# Patient Record
Sex: Female | Born: 1965 | Race: White | Hispanic: No | Marital: Married | Smoking: Current every day smoker
Health system: Southern US, Community
[De-identification: ages and names within clinical notes are randomized; demographics above are authoritative.]

## PROBLEM LIST (undated history)

## (undated) DIAGNOSIS — Z789 Other specified health status: Secondary | ICD-10-CM

## (undated) DIAGNOSIS — R112 Nausea with vomiting, unspecified: Secondary | ICD-10-CM

## (undated) DIAGNOSIS — T8859XA Other complications of anesthesia, initial encounter: Secondary | ICD-10-CM

## (undated) DIAGNOSIS — Z9889 Other specified postprocedural states: Secondary | ICD-10-CM

## (undated) DIAGNOSIS — T4145XA Adverse effect of unspecified anesthetic, initial encounter: Secondary | ICD-10-CM

## (undated) DIAGNOSIS — K219 Gastro-esophageal reflux disease without esophagitis: Secondary | ICD-10-CM

## (undated) DIAGNOSIS — K449 Diaphragmatic hernia without obstruction or gangrene: Secondary | ICD-10-CM

## (undated) DIAGNOSIS — K2980 Duodenitis without bleeding: Secondary | ICD-10-CM

## (undated) HISTORY — PX: HIP SURGERY: SHX245

## (undated) HISTORY — PX: DIAGNOSTIC LAPAROSCOPY: SUR761

## (undated) HISTORY — DX: Gastro-esophageal reflux disease without esophagitis: K21.9

---

## 1998-04-15 ENCOUNTER — Other Ambulatory Visit: Admission: RE | Admit: 1998-04-15 | Discharge: 1998-04-15 | Payer: Self-pay | Admitting: Obstetrics and Gynecology

## 1999-05-10 ENCOUNTER — Other Ambulatory Visit: Admission: RE | Admit: 1999-05-10 | Discharge: 1999-05-10 | Payer: Self-pay | Admitting: Obstetrics and Gynecology

## 2000-06-29 ENCOUNTER — Other Ambulatory Visit: Admission: RE | Admit: 2000-06-29 | Discharge: 2000-06-29 | Payer: Self-pay | Admitting: Obstetrics and Gynecology

## 2001-12-24 ENCOUNTER — Other Ambulatory Visit: Admission: RE | Admit: 2001-12-24 | Discharge: 2001-12-24 | Payer: Self-pay | Admitting: Obstetrics and Gynecology

## 2002-12-12 ENCOUNTER — Emergency Department (HOSPITAL_COMMUNITY): Admission: EM | Admit: 2002-12-12 | Discharge: 2002-12-12 | Payer: Self-pay | Admitting: Emergency Medicine

## 2003-01-06 ENCOUNTER — Other Ambulatory Visit: Admission: RE | Admit: 2003-01-06 | Discharge: 2003-01-06 | Payer: Self-pay | Admitting: Obstetrics and Gynecology

## 2004-03-15 ENCOUNTER — Other Ambulatory Visit: Admission: RE | Admit: 2004-03-15 | Discharge: 2004-03-15 | Payer: Self-pay | Admitting: Obstetrics and Gynecology

## 2004-11-22 ENCOUNTER — Ambulatory Visit: Payer: Self-pay | Admitting: Internal Medicine

## 2004-12-07 ENCOUNTER — Ambulatory Visit (HOSPITAL_COMMUNITY): Admission: RE | Admit: 2004-12-07 | Discharge: 2004-12-07 | Payer: Self-pay | Admitting: Internal Medicine

## 2004-12-07 ENCOUNTER — Ambulatory Visit: Payer: Self-pay | Admitting: Internal Medicine

## 2005-04-06 ENCOUNTER — Other Ambulatory Visit: Admission: RE | Admit: 2005-04-06 | Discharge: 2005-04-06 | Payer: Self-pay | Admitting: Obstetrics and Gynecology

## 2006-07-24 ENCOUNTER — Ambulatory Visit: Payer: Self-pay | Admitting: Orthopedic Surgery

## 2006-07-31 ENCOUNTER — Encounter (HOSPITAL_COMMUNITY): Admission: RE | Admit: 2006-07-31 | Discharge: 2006-08-14 | Payer: Self-pay | Admitting: Orthopedic Surgery

## 2006-08-17 ENCOUNTER — Encounter (HOSPITAL_COMMUNITY): Admission: RE | Admit: 2006-08-17 | Discharge: 2006-09-16 | Payer: Self-pay | Admitting: Orthopedic Surgery

## 2007-02-26 ENCOUNTER — Ambulatory Visit (HOSPITAL_COMMUNITY): Admission: RE | Admit: 2007-02-26 | Discharge: 2007-02-26 | Payer: Self-pay | Admitting: Internal Medicine

## 2010-09-27 ENCOUNTER — Other Ambulatory Visit: Payer: Self-pay | Admitting: Orthopedic Surgery

## 2010-09-27 ENCOUNTER — Encounter (HOSPITAL_COMMUNITY): Payer: BC Managed Care – PPO

## 2010-09-27 DIAGNOSIS — Z01810 Encounter for preprocedural cardiovascular examination: Secondary | ICD-10-CM | POA: Insufficient documentation

## 2010-09-27 DIAGNOSIS — Z01812 Encounter for preprocedural laboratory examination: Secondary | ICD-10-CM | POA: Insufficient documentation

## 2010-09-27 DIAGNOSIS — Z01811 Encounter for preprocedural respiratory examination: Secondary | ICD-10-CM | POA: Insufficient documentation

## 2010-09-27 LAB — URINALYSIS, ROUTINE W REFLEX MICROSCOPIC
Ketones, ur: NEGATIVE mg/dL
Nitrite: NEGATIVE
Protein, ur: NEGATIVE mg/dL
Specific Gravity, Urine: 1.019 (ref 1.005–1.030)
Urine Glucose, Fasting: NEGATIVE mg/dL
pH: 6.5 (ref 5.0–8.0)

## 2010-09-27 LAB — COMPREHENSIVE METABOLIC PANEL
Albumin: 4.4 g/dL (ref 3.5–5.2)
BUN: 16 mg/dL (ref 6–23)
Calcium: 9.5 mg/dL (ref 8.4–10.5)
Glucose, Bld: 96 mg/dL (ref 70–99)
Sodium: 139 mEq/L (ref 135–145)
Total Protein: 7.3 g/dL (ref 6.0–8.3)

## 2010-09-27 LAB — PROTIME-INR
INR: 1.02 (ref 0.00–1.49)
Prothrombin Time: 13.6 seconds (ref 11.6–15.2)

## 2010-09-27 LAB — CBC
MCHC: 35 g/dL (ref 30.0–36.0)
RDW: 12.7 % (ref 11.5–15.5)

## 2010-09-27 LAB — HCG, SERUM, QUALITATIVE: Preg, Serum: NEGATIVE

## 2010-09-27 LAB — SURGICAL PCR SCREEN
MRSA, PCR: INVALID — AB
Staphylococcus aureus: INVALID — AB

## 2010-09-30 LAB — MRSA CULTURE

## 2010-10-01 ENCOUNTER — Ambulatory Visit (HOSPITAL_COMMUNITY)
Admission: RE | Admit: 2010-10-01 | Discharge: 2010-10-01 | Disposition: A | Discharge status: Home / Self Care | Payer: BC Managed Care – PPO | Source: Ambulatory Visit | Attending: Orthopedic Surgery | Admitting: Orthopedic Surgery

## 2010-10-01 ENCOUNTER — Ambulatory Visit (HOSPITAL_COMMUNITY): Payer: BC Managed Care – PPO

## 2010-10-01 DIAGNOSIS — F17200 Nicotine dependence, unspecified, uncomplicated: Secondary | ICD-10-CM | POA: Insufficient documentation

## 2010-10-01 DIAGNOSIS — K219 Gastro-esophageal reflux disease without esophagitis: Secondary | ICD-10-CM | POA: Insufficient documentation

## 2010-10-01 DIAGNOSIS — M24159 Other articular cartilage disorders, unspecified hip: Secondary | ICD-10-CM | POA: Insufficient documentation

## 2010-10-06 NOTE — Op Note (Signed)
NAMECLARISA, Meyers            ACCOUNT NO.:  [id number]  MEDICAL RECORD NO.:  [id number]           PATIENT TYPE:  O  LOCATION:  DAYL                         FACILITY:  [facility]  PHYSICIAN:  Ollen Gross, M.D.    DATE OF BIRTH:  12-06-1965  DATE OF PROCEDURE:  10/01/2010 DATE OF DISCHARGE:                              OPERATIVE REPORT   PREOPERATIVE DIAGNOSIS:  Left hip labral tear.  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  PROCEDURE:  Left hip arthroscopy with labral debridement and chondroplasty.  SURGEON:  Ollen Gross, MD  ASSISTANT:  None.  ANESTHESIA:  General.  ESTIMATED BLOOD LOSS:  Minimal.  DRAIN:  None.  COMPLICATIONS:  None.  CONDITION:  Stable to Recovery.  BRIEF CLINICAL NOTE:  Kimberly Meyers is a 45 year old female with long history of significant left hip pain, popping, and progressive dysfunction.  She had an exam and history suggestive of a labral tear, confirmed by MRI.  She presents now for arthroscopy and debridement.  PROCEDURE IN DETAIL:  After successful administration of general anesthetic, the patient was placed in the right lateral decubitus position with the left side up and held on the fracture table.  Left lower extremity was draped over the well-padded perineal post and foot placed into a well-padded foot holder.  Under fluoroscopic guidance, traction was applied across the hip until it was adequately distracted. Once adequately distracted, it was locked in that position.  The thigh was then prepped and draped in the usual sterile fashion.  Spinal needles passed through the sites marked in the anterior and posterior peritrochanteric portals. Small incision was made, an inflow cannula passed over a cannulated system to enter the joint.  Once confirmed to be intra-articular, then inflow was initiated.  Femoral head looked normal throughout its extent.  Posterior half of the acetabulum was normal.  There was a  tear at the anterior inferior acetabulum at about the 7 o'clock position and also a larger tear in the labrum and that the chondral delamination of the acetabulum at about the 9 to 11 o'clock position.  We created the anterior portal over a nitinol wire.  The anterior inferior labrum showed no detachment and the freed area was debrided back to stable base with the shaver.  The chondral defects about 1 x 1 or 1 x 2 cm in the anterior acetabulum and that was debrided back to stable bony base with stable cartilaginous edges.  The labrum was debrided back to a stable base with the shaver.  We then used the ArthroCare to seal the labrum off.  The labrum was found to be stable. The rest of the joints again inspected.  No other tears, defects, or loose bodies noted.  The arthroscopic equipments were then removed from the anterior portal and then 20 cc of 0.25% Marcaine with epinephrine injected through the inflow cannula, then that portal was removed and the traction released off the joint and the hip was reduced.  The incisions were then closed with interrupted 4-0 nylon.  Then, I cleaned and dried and a bulky sterile dressing was applied.  Hip was placed through range  of motion which was a full range of motion without any popping or catching.  She was subsequently awakened and transported to Recovery in stable condition.     Ollen Gross, M.D.     FA/MEDQ  D:  10/01/2010  T:  10/01/2010  Job:  [id number]  Electronically Signed by Ollen Gross M.D. on 10/06/2010 04:47:42 PM

## 2010-12-31 NOTE — Op Note (Signed)
NAMEALICESON, Kimberly Meyers            ACCOUNT NO.:  [id number]   MEDICAL RECORD NO.:  [id number]          PATIENT TYPE:  AMB   LOCATION:  DAY                           FACILITY:  [facility]   PHYSICIAN:  Lionel December, M.D.    DATE OF BIRTH:  [birth year]   DATE OF PROCEDURE:  12/07/2004  DATE OF DISCHARGE:                                 OPERATIVE REPORT   PROCEDURE:  Esophagogastroduodenoscopy followed by total colonoscopy.   INDICATIONS:  The patient is 45 year old Caucasian female with over a year  history of symptoms of GERD poorly controlled with OTC meds. She also has  over 6-week history of diarrhea with negative stool studies and she was  noted to have heme-positive stool. She is undergoing both diagnostic EGD and  colonoscopy. Procedure risks were reviewed with the patient, informed  consent was obtained.   PREMEDICATION:  Cetacaine spray for pharyngeal topical anesthesia, Demerol  50 milligrams IV, Versed 10 milligrams IV in divided dose.   FINDINGS:  Procedure performed in endoscopy suite. The patient's vital signs  and O2 sat were monitored during the procedure and remained stable.   PROCEDURE:  1.  Esophagogastroduodenoscopy. The patient was placed left lateral      position. Olympus videoscope was passed by oropharynx without any      difficulty into esophagus.   Esophagus. Mucosa of the esophagus was normal throughout. Squamocolumnar  junction was at 38 cm. No hernia was noted.   Stomach was empty and distended very well with insufflation. Folds proximal  stomach were normal. Examination of mucosa at body, antrum, pyloric channel  as well as angularis, fundus and cardia was normal.   Duodenum bulbar mucosa was normal. Scope was placed in the second part of  the duodenum. The mucosa and folds were normal. Endoscope was withdrawn. The  patient prepared for procedure #2.   Colonoscopy. Rectal examination performed. No abnormality noted on external  or digital exam.  Olympus videoscope was placed rectum and advanced under  vision into sigmoid colon and beyond. Preparation was satisfactory. Scope  was passed to cecum which was identified by appendiceal orifice and  ileocecal valve. T I was also examined for 15-20 cm and reveals normal  mucosal pattern. Pictures taken for the record. As the scope was withdrawn  colonic mucosa was examined for the second time and no other mucosal  abnormalities were noted. Rectal mucosa similarly was normal. Scope was  retroflexed to examine anorectal junction which was unremarkable. Endoscope  was straightened and withdrawn. The patient tolerated the procedure well.   FINAL DIAGNOSIS:  Normal esophagogastroduodenoscopy.   Normal colonoscopy and terminal ileoscopy.   She has chronic uncomplicated GERD and I suspect she has IBS although it  would not explain her heme-positive stools.   RECOMMENDATIONS:  She will continue antireflux measures and Protonix 40  milligrams p.o. q.a.m.  Levbid half to 1 tablet p.o. q.a.m.. Will hold off  adding fiber since she had more cramping and diarrhea with fiber in the  past.   Prescription given for both medicines for a month with five refills. She  will return for OV in  eight to 10 weeks. If she remains with diarrhea and  cramps, will consider small-bowel follow-through.      NR/MEDQ  D:  12/07/2004  T:  12/07/2004  Job:  [id number]   cc:   Barbaraann Barthel, M.D.  [address]  [place]  [place] [address]  Fax: [phone]

## 2013-02-19 ENCOUNTER — Other Ambulatory Visit: Payer: Self-pay

## 2013-02-19 DIAGNOSIS — Z1231 Encounter for screening mammogram for malignant neoplasm of breast: Secondary | ICD-10-CM

## 2013-03-11 ENCOUNTER — Ambulatory Visit
Admission: RE | Admit: 2013-03-11 | Discharge: 2013-03-11 | Disposition: A | Discharge status: Home / Self Care | Payer: BC Managed Care – PPO | Source: Ambulatory Visit

## 2013-03-11 DIAGNOSIS — Z1231 Encounter for screening mammogram for malignant neoplasm of breast: Secondary | ICD-10-CM

## 2013-12-09 ENCOUNTER — Other Ambulatory Visit (HOSPITAL_COMMUNITY): Payer: Self-pay | Admitting: Internal Medicine

## 2013-12-09 DIAGNOSIS — R1010 Upper abdominal pain, unspecified: Secondary | ICD-10-CM

## 2013-12-11 ENCOUNTER — Ambulatory Visit (HOSPITAL_COMMUNITY)
Admission: RE | Admit: 2013-12-11 | Discharge: 2013-12-11 | Disposition: A | Discharge status: Home / Self Care | Payer: BC Managed Care – PPO | Source: Ambulatory Visit | Attending: Internal Medicine | Admitting: Internal Medicine

## 2013-12-11 DIAGNOSIS — R109 Unspecified abdominal pain: Secondary | ICD-10-CM | POA: Insufficient documentation

## 2013-12-11 DIAGNOSIS — R1010 Upper abdominal pain, unspecified: Secondary | ICD-10-CM

## 2014-02-03 ENCOUNTER — Other Ambulatory Visit: Payer: Self-pay

## 2014-02-03 DIAGNOSIS — Z1231 Encounter for screening mammogram for malignant neoplasm of breast: Secondary | ICD-10-CM

## 2014-03-17 ENCOUNTER — Encounter (INDEPENDENT_AMBULATORY_CARE_PROVIDER_SITE_OTHER): Payer: Self-pay

## 2014-03-17 ENCOUNTER — Ambulatory Visit
Admission: RE | Admit: 2014-03-17 | Discharge: 2014-03-17 | Disposition: A | Discharge status: Home / Self Care | Payer: BC Managed Care – PPO | Source: Ambulatory Visit

## 2014-03-17 DIAGNOSIS — Z1231 Encounter for screening mammogram for malignant neoplasm of breast: Secondary | ICD-10-CM

## 2014-04-07 ENCOUNTER — Ambulatory Visit (HOSPITAL_COMMUNITY)
Admission: RE | Admit: 2014-04-07 | Discharge: 2014-04-07 | Disposition: A | Discharge status: Home / Self Care | Payer: BC Managed Care – PPO | Source: Ambulatory Visit | Attending: Internal Medicine | Admitting: Internal Medicine

## 2014-04-07 ENCOUNTER — Other Ambulatory Visit (HOSPITAL_COMMUNITY): Payer: Self-pay | Admitting: Internal Medicine

## 2014-04-07 DIAGNOSIS — R079 Chest pain, unspecified: Secondary | ICD-10-CM | POA: Diagnosis not present

## 2014-05-20 ENCOUNTER — Encounter (INDEPENDENT_AMBULATORY_CARE_PROVIDER_SITE_OTHER): Payer: Self-pay | Admitting: *Deleted

## 2014-06-16 ENCOUNTER — Telehealth (INDEPENDENT_AMBULATORY_CARE_PROVIDER_SITE_OTHER): Payer: Self-pay | Admitting: *Deleted

## 2014-06-16 ENCOUNTER — Encounter (INDEPENDENT_AMBULATORY_CARE_PROVIDER_SITE_OTHER): Payer: Self-pay | Admitting: Internal Medicine

## 2014-06-16 ENCOUNTER — Other Ambulatory Visit (INDEPENDENT_AMBULATORY_CARE_PROVIDER_SITE_OTHER): Payer: Self-pay | Admitting: *Deleted

## 2014-06-16 ENCOUNTER — Ambulatory Visit (INDEPENDENT_AMBULATORY_CARE_PROVIDER_SITE_OTHER): Payer: BC Managed Care – PPO | Admitting: Internal Medicine

## 2014-06-16 VITALS — BP 134/62 | HR 80 | Temp 98.1°F | Ht 67.0 in | Wt 134.2 lb

## 2014-06-16 DIAGNOSIS — R195 Other fecal abnormalities: Secondary | ICD-10-CM

## 2014-06-16 DIAGNOSIS — Z1211 Encounter for screening for malignant neoplasm of colon: Secondary | ICD-10-CM

## 2014-06-16 NOTE — Telephone Encounter (Signed)
Patient needs movi prep 

## 2014-06-16 NOTE — Patient Instructions (Signed)
Colonoscopy.  The risks and benefits such as perforation, bleeding, and infection were reviewed with the patient and is agreeable. 

## 2014-06-16 NOTE — Progress Notes (Addendum)
   Subjective:    Patient ID: Kimberly Meyers, female    DOB: 04/04/1966, 48 y.o.   MRN: [id number]  HPI Referred to our office by Dr. Rana SnareLowe ([place] in [place]) for irregular bowel movements. She will sometimes have one stool a day and then she may have 6 stools a day. She sometimes has diarrhea. She has not tried anything for th is. She says most of her stools are formed. She says she will have diarrhea usually after drinking eight beers. Symptoms for several months. Before this, she was having at least one stool a day.  Stools are small now. She describes as snakey. Her stool at Dr. Vance GatherLowe's office was positive. She denies any reflux with the Protonix. Appetite is good. No unintentional weight loss.   No melena or BRRB. No family hx of colon cancer. Family hx of stomach,lung and breast cancer. Colonoscopy years ago by Dr Karilyn Cotaehman and she thinks is was normal  No NSAIDs.   12/11/2013 US abdomen:  IMPRESSION: Unremarkable abdominal ultrasound.      Review of Systems      Past Medical History  Diagnosis Date  . GERD (gastroesophageal reflux disease)     Past Surgical History  Procedure Laterality Date  . Hip surgery      torn cartilidge repair.  (left)    Allergies  Allergen Reactions  . Penicillins     Hives  . Sulfa Antibiotics     Hives, itching   Current outpatient prescriptions: clonazePAM (KLONOPIN) 0.5 MG tablet, Take 0.5 mg by mouth 2 (two) times daily as needed for anxiety., Disp: , Rfl: ;  pantoprazole (PROTONIX) 40 MG tablet, Take 40 mg by mouth daily., Disp: , Rfl:   No current outpatient prescriptions on file prior to visit.   No current facility-administered medications on file prior to visit.     No current outpatient prescriptions on file prior to visit.   No current facility-administered medications on file prior to visit.   No current outpatient prescriptions on file prior to visit.   No current facility-administered medications on file  prior to visit.         Objective:   Physical Exam  Filed Vitals:   06/16/14 1557  Height: 5\' 7"  (1.702 m)  Weight: 134 lb 3.2 oz (60.873 kg)   Alert and oriented. Skin warm and dry. Oral mucosa is moist.   . Sclera anicteric, conjunctivae is pink. Thyroid not enlarged. No cervical lymphadenopathy. Lungs clear. Heart regular rate and rhythm.  Abdomen is soft. Bowel sounds are positive. No hepatomegaly. No abdominal masses felt. No tenderness.  No edema to lower extremities. Stools brown and guaiac negative.        Assessment & Plan:  Rectal bleeding. Colonic neoplasm needs to be rule out.  Hemorroid, polyp, AVM in the differential. Colonoscopy

## 2014-06-17 MED ORDER — PEG-KCL-NACL-NASULF-NA ASC-C 100 G PO SOLR: 1.0000 | Freq: Once | ORAL | Status: DC

## 2014-06-26 DIAGNOSIS — K219 Gastro-esophageal reflux disease without esophagitis: Secondary | ICD-10-CM | POA: Diagnosis not present

## 2014-06-26 DIAGNOSIS — K921 Melena: Secondary | ICD-10-CM | POA: Diagnosis not present

## 2014-06-26 DIAGNOSIS — K644 Residual hemorrhoidal skin tags: Secondary | ICD-10-CM | POA: Diagnosis not present

## 2014-06-26 DIAGNOSIS — Z79899 Other long term (current) drug therapy: Secondary | ICD-10-CM | POA: Diagnosis not present

## 2014-06-27 ENCOUNTER — Encounter (HOSPITAL_COMMUNITY)
Admission: RE | Disposition: A | Discharge status: Home / Self Care | Payer: Self-pay | Source: Ambulatory Visit | Attending: Internal Medicine

## 2014-06-27 ENCOUNTER — Ambulatory Visit (HOSPITAL_COMMUNITY)
Admission: RE | Admit: 2014-06-27 | Discharge: 2014-06-27 | Disposition: A | Discharge status: Home / Self Care | Payer: BC Managed Care – PPO | Source: Ambulatory Visit | Attending: Internal Medicine | Admitting: Internal Medicine

## 2014-06-27 ENCOUNTER — Encounter (HOSPITAL_COMMUNITY): Payer: Self-pay

## 2014-06-27 DIAGNOSIS — Z79899 Other long term (current) drug therapy: Secondary | ICD-10-CM | POA: Insufficient documentation

## 2014-06-27 DIAGNOSIS — R194 Change in bowel habit: Secondary | ICD-10-CM

## 2014-06-27 DIAGNOSIS — K219 Gastro-esophageal reflux disease without esophagitis: Secondary | ICD-10-CM | POA: Insufficient documentation

## 2014-06-27 DIAGNOSIS — K921 Melena: Secondary | ICD-10-CM | POA: Diagnosis not present

## 2014-06-27 DIAGNOSIS — K644 Residual hemorrhoidal skin tags: Secondary | ICD-10-CM | POA: Insufficient documentation

## 2014-06-27 DIAGNOSIS — R195 Other fecal abnormalities: Secondary | ICD-10-CM

## 2014-06-27 DIAGNOSIS — K649 Unspecified hemorrhoids: Secondary | ICD-10-CM

## 2014-06-27 HISTORY — DX: Other complications of anesthesia, initial encounter: T88.59XA

## 2014-06-27 HISTORY — DX: Adverse effect of unspecified anesthetic, initial encounter: T41.45XA

## 2014-06-27 HISTORY — DX: Other specified health status: Z78.9

## 2014-06-27 HISTORY — DX: Other specified postprocedural states: R11.2

## 2014-06-27 HISTORY — DX: Other specified postprocedural states: Z98.890

## 2014-06-27 HISTORY — PX: COLONOSCOPY: SHX5424

## 2014-06-27 SURGERY — COLONOSCOPY
Anesthesia: Moderate Sedation

## 2014-06-27 MED ORDER — MEPERIDINE HCL 50 MG/ML IJ SOLN
INTRAMUSCULAR | Status: DC | PRN
  Administered 2014-06-27 (×2): 25 mg via INTRAVENOUS

## 2014-06-27 MED ORDER — STERILE WATER FOR IRRIGATION IR SOLN
Status: DC | PRN
  Administered 2014-06-27: 10:00:00

## 2014-06-27 MED ORDER — MEPERIDINE HCL 50 MG/ML IJ SOLN
INTRAMUSCULAR | Status: AC
  Filled 2014-06-27: qty 1

## 2014-06-27 MED ORDER — BENEFIBER DRINK MIX PO PACK: 4.0000 g | PACK | Freq: Every day | ORAL | Status: AC

## 2014-06-27 MED ORDER — MIDAZOLAM HCL 5 MG/5ML IJ SOLN
INTRAMUSCULAR | Status: AC
  Filled 2014-06-27: qty 10

## 2014-06-27 MED ORDER — SODIUM CHLORIDE 0.9 % IV SOLN
INTRAVENOUS | Status: DC
  Administered 2014-06-27: 08:00:00 via INTRAVENOUS

## 2014-06-27 MED ORDER — MIDAZOLAM HCL 5 MG/5ML IJ SOLN
INTRAMUSCULAR | Status: DC | PRN
  Administered 2014-06-27 (×3): 2 mg via INTRAVENOUS

## 2014-06-27 NOTE — H&P (Signed)
Kimberly Meyers is an 48 y.o. female.   Chief Complaint: Patient is here for colonoscopy. HPI: Patient is 48 year old Caucasian female who has irregular bowel movements. She was recently noted to have heme-positive stools by her gynecologist Dr. Corinna Meyers.she has noted change in her bowel habits over the last few months. She denies rectal bleeding abdominal pain or weight loss.patient states she had colonoscopy several years ago and was normal. Family history is negative for CRC but her mother has had colonic polyps.  Past Medical History  Diagnosis Date  . GERD (gastroesophageal reflux disease)   . Complication of anesthesia   . PONV (postoperative nausea and vomiting)   . Medical history non-contributory        History of endometriosis. Had laparoscopy years ago.  Past Surgical History  Procedure Laterality Date  . Hip surgery      torn cartilidge repair.  (left)    History reviewed. No pertinent family history. Social History:  reports that she has been smoking.  She does not have any smokeless tobacco history on file. She reports that she drinks alcohol. She reports that she does not use illicit drugs.  Allergies:  Allergies  Allergen Reactions  . Penicillins     Hives  . Sulfa Antibiotics     Hives, itching    Medications Prior to Admission  Medication Sig Dispense Refill  . clonazePAM (KLONOPIN) 0.5 MG tablet Take 0.5 mg by mouth 2 (two) times daily as needed for anxiety.    . pantoprazole (PROTONIX) 40 MG tablet Take 40 mg by mouth daily.    . peg 3350 powder (MOVIPREP) 100 G SOLR Take 1 kit (200 g total) by mouth once. 1 kit 0    No results found for this or any previous visit (from the past 48 hour(s)). No results found.  ROS  Blood pressure 124/76, pulse 78, temperature 97.8 F (36.6 C), temperature source Oral, resp. rate 14, SpO2 97 %. Physical Exam  Constitutional: She appears well-developed and well-nourished.  HENT:  Mouth/Throat: Oropharynx is clear and  moist.  Eyes: Conjunctivae are normal. No scleral icterus.  Neck: No thyromegaly present.  Cardiovascular: Normal rate, regular rhythm and normal heart sounds.   No murmur heard. Respiratory: Effort normal and breath sounds normal.  GI: Soft. She exhibits no mass. There is tenderness (mild midepigastric tenderness).  Musculoskeletal: She exhibits no edema.  Lymphadenopathy:    She has no cervical adenopathy.  Neurological: She is alert.  Skin: Skin is warm and dry.     Assessment/Plan Change in bowel habits and heme positive stool. Diagnostic colonoscopy.  REHMAN,NAJEEB U 06/27/2014, 8:59 AM

## 2014-06-27 NOTE — Discharge Instructions (Signed)
Resume usual medications. High fiber diet. Benefiber 4 g by mouth daily at bedtime. No driving for 24 hours. Stool diary for 1 month and sent as summary.  High-Fiber Diet Fiber is found in fruits, vegetables, and grains. A high-fiber diet encourages the addition of more whole grains, legumes, fruits, and vegetables in your diet. The recommended amount of fiber for adult males is 38 g per day. For adult females, it is 25 g per day. Pregnant and lactating women should get 28 g of fiber per day. If you have a digestive or bowel problem, ask your caregiver for advice before adding high-fiber foods to your diet. Eat a variety of high-fiber foods instead of only a select few type of foods.  PURPOSE  To increase stool bulk.  To make bowel movements more regular to prevent constipation.  To lower cholesterol.  To prevent overeating. WHEN IS THIS DIET USED?  It may be used if you have constipation and hemorrhoids.  It may be used if you have uncomplicated diverticulosis (intestine condition) and irritable bowel syndrome.  It may be used if you need help with weight management.  It may be used if you want to add it to your diet as a protective measure against atherosclerosis, diabetes, and cancer. SOURCES OF FIBER  Whole-grain breads and cereals.  Fruits, such as apples, oranges, bananas, berries, prunes, and pears.  Vegetables, such as green peas, carrots, sweet potatoes, beets, broccoli, cabbage, spinach, and artichokes.  Legumes, such split peas, soy, lentils.  Almonds. FIBER CONTENT IN FOODS Starches and Grains / Dietary Fiber (g)  Cheerios, 1 cup / 3 g  Corn Flakes cereal, 1 cup / 0.7 g  Rice crispy treat cereal, 1 cup / 0.3 g  Instant oatmeal (cooked),  cup / 2 g  Frosted wheat cereal, 1 cup / 5.1 g  Brown, long-grain rice (cooked), 1 cup / 3.5 g  Patrick Sohm, long-grain rice (cooked), 1 cup / 0.6 g  Enriched macaroni (cooked), 1 cup / 2.5 g Legumes / Dietary Fiber  (g)  Baked beans (canned, plain, or vegetarian),  cup / 5.2 g  Kidney beans (canned),  cup / 6.8 g  Pinto beans (cooked),  cup / 5.5 g Breads and Crackers / Dietary Fiber (g)  Plain or honey graham crackers, 2 squares / 0.7 g  Saltine crackers, 3 squares / 0.3 g  Plain, salted pretzels, 10 pieces / 1.8 g  Whole-wheat bread, 1 slice / 1.9 g  Glendene Wyer bread, 1 slice / 0.7 g  Raisin bread, 1 slice / 1.2 g  Plain bagel, 3 oz / 2 g  Flour tortilla, 1 oz / 0.9 g  Corn tortilla, 1 small / 1.5 g  Hamburger or hotdog bun, 1 small / 0.9 g Fruits / Dietary Fiber (g)  Apple with skin, 1 medium / 4.4 g  Sweetened applesauce,  cup / 1.5 g  Banana,  medium / 1.5 g  Grapes, 10 grapes / 0.4 g  Orange, 1 small / 2.3 g  Raisin, 1.5 oz / 1.6 g  Melon, 1 cup / 1.4 g Vegetables / Dietary Fiber (g)  Green beans (canned),  cup / 1.3 g  Carrots (cooked),  cup / 2.3 g  Broccoli (cooked),  cup / 2.8 g  Peas (cooked),  cup / 4.4 g  Mashed potatoes,  cup / 1.6 g  Lettuce, 1 cup / 0.5 g  Corn (canned),  cup / 1.6 g  Tomato,  cup / 1.1 g Document Released:  08/01/2005 Document Revised: 01/31/2012 Document Reviewed: 11/03/2011 ExitCare Patient Information 2015 [employer]. This information is not intended to replace advice given to you by your health care provider. Make sure you discuss any questions you have with your health care provider.   Colonoscopy, Care After Refer to this sheet in the next few weeks. These instructions provide you with information on caring for yourself after your procedure. Your health care provider may also give you more specific instructions. Your treatment has been planned according to current medical practices, but problems sometimes occur. Call your health care provider if you have any problems or questions after your procedure. WHAT TO EXPECT AFTER THE PROCEDURE  After your procedure, it is typical to have the following:  A small amount  of blood in your stool.  Moderate amounts of gas and mild abdominal cramping or bloating. HOME CARE INSTRUCTIONS  Do not drive, operate machinery, or sign important documents for 24 hours.  You may shower and resume your regular physical activities, but move at a slower pace for the first 24 hours.  Take frequent rest periods for the first 24 hours.  Walk around or put a warm pack on your abdomen to help reduce abdominal cramping and bloating.  Drink enough fluids to keep your urine clear or pale yellow.  You may resume your normal diet as instructed by your health care provider. Avoid heavy or fried foods that are hard to digest.  Avoid drinking alcohol for 24 hours or as instructed by your health care provider.  Only take over-the-counter or prescription medicines as directed by your health care provider.  If a tissue sample (biopsy) was taken during your procedure:  Do not take aspirin or blood thinners for 7 days, or as instructed by your health care provider.  Do not drink alcohol for 7 days, or as instructed by your health care provider.  Eat soft foods for the first 24 hours. SEEK MEDICAL CARE IF: You have persistent spotting of blood in your stool 2-3 days after the procedure. SEEK IMMEDIATE MEDICAL CARE IF:  You have more than a small spotting of blood in your stool.  You pass large blood clots in your stool.  Your abdomen is swollen (distended).  You have nausea or vomiting.  You have a fever.  You have increasing abdominal pain that is not relieved with medicine. Document Released: 03/15/2004 Document Revised: 05/22/2013 Document Reviewed: 04/08/2013 [facility] Patient Information 2015 [employer] [place]. This information is not intended to replace advice given to you by your health care provider. Make sure you discuss any questions you have with your health care provider.

## 2014-06-27 NOTE — Op Note (Signed)
COLONOSCOPY PROCEDURE REPORT  PATIENT:  Kimberly Meyers  MR#:  [id number] Birthdate:  09/11/1965, 48 y.o., female Endoscopist:  Dr. Malissa HippoNajeeb U. Rehman, MD Referred By:  Dr. Turner Danielsavid C Lowe, MD Procedure Date: 06/27/2014  Procedure:   Colonoscopy  Indications:  Patient is 48 year old Caucasian female who was recently noted to have heme-positive stool by her gynecologist Dr. Rachell CiproLow. She denies rectal bleeding or abdominal pain but does give history of irregular bowel movements. She also has noted change in stool caliber. Family history is negative for CRC.  Informed Consent:  The procedure and risks were reviewed with the patient and informed consent was obtained.  Medications:  Demerol 50 mg IV Versed 8 mg IV  Description of procedure:  After a digital rectal exam was performed, that colonoscope was advanced from the anus through the rectum and colon to the area of the cecum, ileocecal valve and appendiceal orifice. The cecum was deeply intubated. These structures were well-seen and photographed for the record. From the level of the cecum and ileocecal valve, the scope was slowly and cautiously withdrawn. The mucosal surfaces were carefully surveyed utilizing scope tip to flexion to facilitate fold flattening as needed. The scope was pulled down into the rectum where a thorough exam including retroflexion was performed. Ruminal ileum was also examined.  Findings:   Prep satisfactory. Normal mucosa of terminal ileum. Normal mucosa of cecum, ascending colon, hepatic flexure, transverse colon, splenic flexure, descending and sigmoid colon. Normal rectal mucosa. Small hemorrhoids below the dentate line and thickened anoderm.      Therapeutic/Diagnostic Maneuvers Performed:   None  Complications:  none  Cecal Withdrawal Time:  10 minutes  Impression:  Normal mucosa of terminal ileum Normal colonoscopy except external hemorrhoids.  Comment; Irregular bowel movements would appear to be  secondary to irritable bowel syndrome given negative ileocolonoscopy.  Recommendations:  Standard instructions given. High fiber diet. Benefiber 4 g by mouth daily at bedtime Patient will keep stool diary for 1 month and send us summary. Will check CBC and Hemoccult in 3 months.  REHMAN,NAJEEB U  06/27/2014 10:05 AM  CC: Dr. Carylon PerchesFAGAN,ROY, MD & Dr. Bonnetta BarryNo ref. provider found CC: Dr. Turner Danielsavid C Lowe, MD

## 2014-06-30 ENCOUNTER — Encounter (HOSPITAL_COMMUNITY): Payer: Self-pay | Admitting: Internal Medicine

## 2015-04-01 ENCOUNTER — Other Ambulatory Visit: Payer: Self-pay

## 2015-04-01 DIAGNOSIS — Z1231 Encounter for screening mammogram for malignant neoplasm of breast: Secondary | ICD-10-CM

## 2015-05-11 ENCOUNTER — Ambulatory Visit
Admission: RE | Admit: 2015-05-11 | Discharge: 2015-05-11 | Disposition: A | Discharge status: Home / Self Care | Payer: 59 | Source: Ambulatory Visit

## 2015-05-11 DIAGNOSIS — Z1231 Encounter for screening mammogram for malignant neoplasm of breast: Secondary | ICD-10-CM

## 2015-09-10 ENCOUNTER — Encounter (HOSPITAL_COMMUNITY): Payer: Self-pay

## 2015-09-10 ENCOUNTER — Emergency Department (HOSPITAL_COMMUNITY)
Admission: EM | Admit: 2015-09-10 | Discharge: 2015-09-10 | Disposition: A | Discharge status: Home / Self Care | Payer: BLUE CROSS/BLUE SHIELD | Source: Home / Self Care | Attending: Family Medicine | Admitting: Family Medicine

## 2015-09-10 DIAGNOSIS — K449 Diaphragmatic hernia without obstruction or gangrene: Secondary | ICD-10-CM | POA: Diagnosis not present

## 2015-09-10 LAB — POCT I-STAT, CHEM 8
BUN: 18 mg/dL (ref 6–20)
CHLORIDE: 104 mmol/L (ref 101–111)
Calcium, Ion: 1.23 mmol/L (ref 1.12–1.23)
Creatinine, Ser: 0.6 mg/dL (ref 0.44–1.00)
GLUCOSE: 102 mg/dL — AB (ref 65–99)
HCT: 40 % (ref 36.0–46.0)
Hemoglobin: 13.6 g/dL (ref 12.0–15.0)
Potassium: 3.5 mmol/L (ref 3.5–5.1)
SODIUM: 142 mmol/L (ref 135–145)
TCO2: 26 mmol/L (ref 0–100)

## 2015-09-10 NOTE — ED Provider Notes (Signed)
CSN: [id number]     Arrival date & time 09/10/15  1302 History   First MD Initiated Contact with Patient 09/10/15 1326     Chief Complaint  Patient presents with  . Chest Pain  . Sore Throat   (Consider location/radiation/quality/duration/timing/severity/associated sxs/prior Treatment) HPI Patient presents with multiple symptoms for over one year including pain in her upper epigastric area, fluttering in her chest, anxiety. Patient states that most of the symptoms are been present now for the at least a year. She has not followed up with her doctor. Patient has had a colonoscopy but has never had an endoscopy. She was started on pantoprazole by her gastroenterologist. She states that she occasionally has a fluttering sensation in her chest it is short-lived. She is also states that she has restarted her "nerve medicine" because she's felt some anxiety over the last couple of days. She denies any nausea vomiting diarrhea or double vision. No other neurologic symptoms. Past Medical History  Diagnosis Date  . GERD (gastroesophageal reflux disease)   . Complication of anesthesia   . PONV (postoperative nausea and vomiting)   . Medical history non-contributory    Past Surgical History  Procedure Laterality Date  . Hip surgery      torn cartilidge repair.  (left)  . Diagnostic laparoscopy      foe endometriosis  . Colonoscopy N/A 06/27/2014    Procedure: COLONOSCOPY;  Surgeon: Malissa Hippo, MD;  Location: [facility];  Service: Endoscopy;  Laterality: N/A;  830   Family History  Problem Relation Age of Onset  . Cancer Mother    Social History  Substance Use Topics  . Smoking status: Current Every Day Smoker  . Smokeless tobacco: None     Comment: 1 pack a day x 30 yrs.   . Alcohol Use: 0.0 oz/week    0 Standard drinks or equivalent per week     Comment: 8 beers a week   OB History    No data available     Review of Systems See history of present illness Allergies   Penicillins and Sulfa antibiotics  Home Medications   Prior to Admission medications   Medication Sig Start Date End Date Taking? Authorizing Provider  clonazePAM (KLONOPIN) 0.5 MG tablet Take 0.5 mg by mouth 2 (two) times daily as needed for anxiety.   Yes Historical Provider, MD  pantoprazole (PROTONIX) 40 MG tablet Take 40 mg by mouth daily.   Yes Historical Provider, MD  Wheat Dextrin (BENEFIBER DRINK MIX) PACK Take 4 g by mouth at bedtime. 06/27/14   Malissa Hippo, MD   Meds Ordered and Administered this Visit  Medications - No data to display  BP 151/81 mmHg  Pulse 73  Temp(Src) 98 F (36.7 C) (Oral)  Resp 18  SpO2 100% No data found.   Physical Exam  Constitutional: She is oriented to person, place, and time. She appears well-developed and well-nourished.  HENT:  Head: Normocephalic and atraumatic.  Right Ear: External ear normal.  Left Ear: External ear normal.  Mouth/Throat: Oropharynx is clear and moist.  Eyes: Conjunctivae are normal.  Neck: Neck supple.  Cardiovascular: Normal rate and normal heart sounds.   Pulmonary/Chest: Effort normal and breath sounds normal.  Abdominal: Soft.  Musculoskeletal: Normal range of motion.  Neurological: She is alert and oriented to person, place, and time.  Skin: Skin is warm and dry.  Psychiatric: She has a normal mood and affect. Her behavior is normal. Judgment and thought  content normal.  Nursing note and vitals reviewed.   ED Course  Procedures (including critical care time)  Labs Review Labs Reviewed  POCT I-STAT, CHEM 8 - Abnormal; Notable for the following:    Glucose, Bld 102 (*)    All other components within normal limits    Imaging Review No results found.   Visual Acuity Review  Right Eye Distance:   Left Eye Distance:   Bilateral Distance:    Right Eye Near:   Left Eye Near:    Bilateral Near:        EKG: normal EKG, normal sinus rhythm, there are no previous tracings available for  comparison. There are no worsening symptoms or signs that I find today.   MDM   1. Hiatal hernia    Discussion with patient that EKG and labs are normal at this time. Patient is advised that she should see her gastroenterologist for follow-up. No new medications are provided. Instructions of care provided discharged home in stable condition   Tharon Aquas, [place] 09/10/15 1544

## 2015-09-10 NOTE — ED Notes (Signed)
Pt stated that she has been having throat discomfort and chest pain for months Pt alert and oriented

## 2015-09-10 NOTE — Discharge Instructions (Signed)
° °Hiatal Hernia °A hiatal hernia occurs when part of your stomach slides above the muscle that separates your abdomen from your chest (diaphragm). You can be born with a hiatal hernia (congenital), or it may develop over time. In almost all cases of hiatal hernia, only the top part of the stomach pushes through.  °Many people have a hiatal hernia with no symptoms. The larger the hernia, the more likely that you will have symptoms. In some cases, a hiatal hernia allows stomach acid to flow back into the tube that carries food from your mouth to your stomach (esophagus). This may cause heartburn symptoms. Severe heartburn symptoms may mean you have developed a condition called gastroesophageal reflux disease (GERD).  °CAUSES  °Hiatal hernias are caused by a weakness in the opening (hiatus) where your esophagus passes through your diaphragm to attach to the upper part of your stomach. You may be born with a weakness in your hiatus, or a weakness can develop. °RISK FACTORS °Older age is a major risk factor for a hiatal hernia. Anything that increases pressure on your diaphragm can also increase your risk of a hiatal hernia. This includes: °· Pregnancy. °· Excess weight. °· Frequent constipation. °SIGNS AND SYMPTOMS  °People with a hiatal hernia often have no symptoms. If symptoms develop, they are almost always caused by GERD. They may include: °· Heartburn. °· Belching. °· Indigestion. °· Trouble swallowing. °· Coughing or wheezing.  °· Sore throat. °· Hoarseness. °· Chest pain. °DIAGNOSIS  °A hiatal hernia is sometimes found during an exam for another problem. Your health care provider may suspect a hiatal hernia if you have symptoms of GERD. Tests may be done to diagnose GERD. These may include: °· X-rays of your stomach or chest. °· An upper gastrointestinal (GI) series. This is an X-ray exam of your GI tract involving the use of a chalky liquid that you swallow. The liquid shows up clearly on the  X-ray. °· Endoscopy. This is a procedure to look into your stomach using a thin, flexible tube that has a tiny camera and light on the end of it. °TREATMENT  °If you have no symptoms, you may not need treatment. If you have symptoms, treatment may include: °· Dietary and lifestyle changes to help reduce GERD symptoms. °· Medicines. These may include: °· Over-the-counter antacids. °· Medicines that make your stomach empty more quickly. °· Medicines that block the production of stomach acid (H2 blockers). °· Stronger medicines to reduce stomach acid (proton pump inhibitors). °· You may need surgery to repair the hernia if other treatments are not helping. °HOME CARE INSTRUCTIONS  °· Take all medicines as directed by your health care provider. °· Quit smoking, if you smoke. °· Try to achieve and maintain a healthy body weight. °· Eat frequent small meals instead of three large meals a day. This keeps your stomach from getting too full. °¨ Eat slowly. °¨ Do not lie down right after eating.  °¨ Do not eat 1-2 hours before bed.   °· Do not drink beverages with caffeine. These include cola, coffee, cocoa, and tea. °· Do not drink alcohol. °· Avoid foods that can make symptoms of GERD worse. These may include: °¨ Fatty foods. °¨ Citrus fruits. °¨ Other foods and drinks that contain acid. °· Avoid putting pressure on your belly. Anything that puts pressure on your belly increases the amount of acid that may be pushed up into your esophagus.   °¨ Avoid bending over, especially after eating. °¨ Raise the head of your   bed by putting blocks under the legs. This keeps your head and esophagus higher than your stomach. °¨ Do not wear tight clothing around your chest or stomach. °¨ Try not to strain when having a bowel movement, when urinating, or when lifting heavy objects. °SEEK MEDICAL CARE IF: °· Your symptoms are not controlled with medicines or lifestyle changes. °· You are having trouble swallowing. °· You have coughing or  wheezing that will not go away. °SEEK IMMEDIATE MEDICAL CARE IF: °· Your pain is getting worse. °· Your pain spreads to your arms, neck, jaw, teeth, or back. °· You have shortness of breath. °· You sweat for no reason. °· You feel sick to your stomach (nauseous) or vomit. °· You vomit blood. °· You have bright red blood in your stools. °· You have black, tarry stools.   °  °This information is not intended to replace advice given to you by your health care provider. Make sure you discuss any questions you have with your health care provider. °  °Document Released: 10/22/2003 Document Revised: 08/22/2014 Document Reviewed: 07/19/2013 °Elsevier Interactive Patient Education ©2016 [employer] ° °Gastroesophageal Reflux Disease, Adult °Normally, food travels down the esophagus and stays in the stomach to be digested. If a person has gastroesophageal reflux disease (GERD), food and stomach acid move back up into the esophagus. When this happens, the esophagus becomes sore and swollen (inflamed). Over time, GERD can make small holes (ulcers) in the lining of the esophagus. °HOME CARE °Diet  °· Follow a diet as told by your doctor. You may need to avoid foods and drinks such as: °¨ Coffee and tea (with or without caffeine). °¨ Drinks that contain alcohol. °¨ Energy drinks and sports drinks. °¨ Carbonated drinks or sodas. °¨ Chocolate and cocoa. °¨ Peppermint and mint flavorings. °¨ Garlic and onions. °¨ Horseradish. °¨ Spicy and acidic foods, such as peppers, chili powder, curry powder, vinegar, hot sauces, and BBQ sauce. °¨ Citrus fruit juices and citrus fruits, such as oranges, lemons, and limes. °¨ Tomato-based foods, such as red sauce, chili, salsa, and pizza with red sauce. °¨ Fried and fatty foods, such as donuts, french fries, potato chips, and high-fat dressings. °¨ High-fat meats, such as hot dogs, rib eye steak, sausage, ham, and bacon. °¨ High-fat dairy items, such as whole milk, butter, and cream  cheese. °· Eat small meals often. Avoid eating large meals. °· Avoid drinking large amounts of liquid with your meals. °· Avoid eating meals during the 2-3 hours before bedtime. °· Avoid lying down right after you eat. °· Do not exercise right after you eat. ° General Instructions  °· Pay attention to any changes in your symptoms. °· Take over-the-counter and prescription medicines only as told by your doctor. Do not take aspirin, ibuprofen, or other NSAIDs unless your doctor says it is okay. °· Do not use any tobacco products, including cigarettes, chewing tobacco, and e-cigarettes. If you need help quitting, ask your doctor. °· Wear loose clothes. Do not wear anything tight around your waist. °· Raise (elevate) the head of your bed about 6 inches (15 cm). °· Try to lower your stress. If you need help doing this, ask your doctor. °· If you are overweight, lose an amount of weight that is healthy for you. Ask your doctor about a safe weight loss goal. °· Keep all follow-up visits as told by your doctor. This is important. °GET HELP IF: °· You have new symptoms. °· You lose weight and you do not know why   it is happening. °· You have trouble swallowing, or it hurts to swallow. °· You have wheezing or a cough that keeps happening. °· Your symptoms do not get better with treatment. °· You have a hoarse voice. °GET HELP RIGHT AWAY IF: °· You have pain in your arms, neck, jaw, teeth, or back. °· You feel sweaty, dizzy, or light-headed. °· You have chest pain or shortness of breath. °· You throw up (vomit) and your throw up looks like blood or coffee grounds. °· You pass out (faint). °· Your poop (stool) is bloody or black. °· You cannot swallow, drink, or eat. °  °This information is not intended to replace advice given to you by your health care provider. Make sure you discuss any questions you have with your health care provider. °  °Document Released: 01/18/2008 Document Revised: 04/22/2015 Document Reviewed:  11/26/2014 °Elsevier Interactive Patient Education ©2016 Else[employer] Inc. ° ° °

## 2015-10-12 ENCOUNTER — Telehealth (INDEPENDENT_AMBULATORY_CARE_PROVIDER_SITE_OTHER): Payer: Self-pay | Admitting: *Deleted

## 2015-10-12 ENCOUNTER — Other Ambulatory Visit (INDEPENDENT_AMBULATORY_CARE_PROVIDER_SITE_OTHER): Payer: Self-pay | Admitting: *Deleted

## 2015-10-12 DIAGNOSIS — G8929 Other chronic pain: Secondary | ICD-10-CM

## 2015-10-12 DIAGNOSIS — R1013 Epigastric pain: Secondary | ICD-10-CM

## 2015-10-12 DIAGNOSIS — R112 Nausea with vomiting, unspecified: Secondary | ICD-10-CM

## 2015-10-12 NOTE — Telephone Encounter (Signed)
Referring MD/PCP: fagan   Procedure: egd  Reason/Indication:  N/V, epigastric pain   Has patient had this procedure before?  no  If so, when, by whom and where?    Is there a family history of colon cancer?  no  Who?  What age when diagnosed?    Is patient diabetic?   no      Does patient have prosthetic heart valve or mechanical valve?  no  Do you have a pacemaker?  no  Has patient ever had endocarditis? no  Has patient had joint replacement within last 12 months?  no  Does patient tend to be constipated or take laxatives? no  Does patient have a history of alcohol/drug use?  no  Is patient on Coumadin, Plavix and/or Aspirin? no  Medications: see epic  Allergies: see epic  Medication Adjustment:   Procedure date & time: 10/16/15 at 1240

## 2015-10-12 NOTE — Telephone Encounter (Signed)
agree

## 2015-10-16 ENCOUNTER — Encounter (HOSPITAL_COMMUNITY): Payer: Self-pay | Admitting: *Deleted

## 2015-10-16 ENCOUNTER — Ambulatory Visit (HOSPITAL_COMMUNITY)
Admission: RE | Admit: 2015-10-16 | Discharge: 2015-10-16 | Disposition: A | Discharge status: Home / Self Care | Payer: BLUE CROSS/BLUE SHIELD | Source: Ambulatory Visit | Attending: Internal Medicine | Admitting: Internal Medicine

## 2015-10-16 ENCOUNTER — Encounter (HOSPITAL_COMMUNITY)
Admission: RE | Disposition: A | Discharge status: Home / Self Care | Payer: Self-pay | Source: Ambulatory Visit | Attending: Internal Medicine

## 2015-10-16 DIAGNOSIS — K2980 Duodenitis without bleeding: Secondary | ICD-10-CM | POA: Diagnosis not present

## 2015-10-16 DIAGNOSIS — R1013 Epigastric pain: Secondary | ICD-10-CM | POA: Diagnosis not present

## 2015-10-16 DIAGNOSIS — R112 Nausea with vomiting, unspecified: Secondary | ICD-10-CM

## 2015-10-16 DIAGNOSIS — Z79899 Other long term (current) drug therapy: Secondary | ICD-10-CM | POA: Diagnosis not present

## 2015-10-16 DIAGNOSIS — G8929 Other chronic pain: Secondary | ICD-10-CM

## 2015-10-16 DIAGNOSIS — K210 Gastro-esophageal reflux disease with esophagitis: Secondary | ICD-10-CM | POA: Diagnosis not present

## 2015-10-16 DIAGNOSIS — K208 Other esophagitis: Secondary | ICD-10-CM | POA: Diagnosis not present

## 2015-10-16 DIAGNOSIS — F17200 Nicotine dependence, unspecified, uncomplicated: Secondary | ICD-10-CM | POA: Diagnosis not present

## 2015-10-16 DIAGNOSIS — R079 Chest pain, unspecified: Secondary | ICD-10-CM | POA: Diagnosis not present

## 2015-10-16 DIAGNOSIS — K449 Diaphragmatic hernia without obstruction or gangrene: Secondary | ICD-10-CM | POA: Insufficient documentation

## 2015-10-16 HISTORY — PX: ESOPHAGOGASTRODUODENOSCOPY: SHX5428

## 2015-10-16 SURGERY — EGD (ESOPHAGOGASTRODUODENOSCOPY)
Anesthesia: Moderate Sedation

## 2015-10-16 MED ORDER — BUTAMBEN-TETRACAINE-BENZOCAINE 2-2-14 % EX AERO
INHALATION_SPRAY | CUTANEOUS | Status: DC | PRN
  Administered 2015-10-16: 2 via TOPICAL

## 2015-10-16 MED ORDER — MIDAZOLAM HCL 5 MG/5ML IJ SOLN
INTRAMUSCULAR | Status: AC
  Filled 2015-10-16: qty 10

## 2015-10-16 MED ORDER — MEPERIDINE HCL 50 MG/ML IJ SOLN
INTRAMUSCULAR | Status: DC | PRN
  Administered 2015-10-16 (×2): 25 mg via INTRAVENOUS

## 2015-10-16 MED ORDER — MEPERIDINE HCL 50 MG/ML IJ SOLN
INTRAMUSCULAR | Status: AC
  Filled 2015-10-16: qty 1

## 2015-10-16 MED ORDER — STERILE WATER FOR IRRIGATION IR SOLN
Status: DC | PRN
  Administered 2015-10-16: 12:00:00

## 2015-10-16 MED ORDER — MIDAZOLAM HCL 5 MG/5ML IJ SOLN
INTRAMUSCULAR | Status: DC | PRN
  Administered 2015-10-16: 3 mg via INTRAVENOUS
  Administered 2015-10-16 (×2): 2 mg via INTRAVENOUS
  Administered 2015-10-16: 3 mg via INTRAVENOUS

## 2015-10-16 MED ORDER — SODIUM CHLORIDE 0.9 % IV SOLN
INTRAVENOUS | Status: DC
  Administered 2015-10-16: 11:00:00 via INTRAVENOUS

## 2015-10-16 NOTE — Op Note (Signed)
EGD PROCEDURE REPORT  PATIENT:  Kimberly Meyers  MR#:  [id number] Birthdate:  10/10/1965, 50 y.o., female Endoscopist:  Dr. Malissa HippoNajeeb U. Rehman, MD Referred By:  Dr. Carylon Perchesoy Fagan, MD Procedure Date: 10/16/2015  Procedure:   EGD  Indications:  Patient is 50 year old Caucasian female who presents with multiple symptoms which include lump in her throat epigastric and chest pain nausea for sporadic vomiting. She has not responded to pantoprazole which she has been taking for the last 2 weeks. She used to take a lot of BC powder but not in the last 4-5 years. She states her symptoms got worse after she quit cigarette smoking 4 months ago.            Informed Consent:  The risks, benefits, alternatives & imponderables which include, but are not limited to, bleeding, infection, perforation, drug reaction and potential missed lesion have been reviewed.  The potential for biopsy, lesion removal, esophageal dilation, etc. have also been discussed.  Questions have been answered.  All parties agreeable.  Please see history & physical in medical record for more information.  Medications:  Demerol 50 mg IV Versed 10 mg IV Cetacaine spray topically for oropharyngeal anesthesia  First dose administered at 1215 Last dose administered at  1224  Description of procedure:  The endoscope was introduced through the mouth and advanced to the second portion of the duodenum without difficulty or limitations. The mucosal surfaces were surveyed very carefully during advancement of the scope and upon withdrawal.  Findings:  Esophagus: Mucosa of the esophagus was normal. Focal erythema noted at GE junction. GEJ:  38 cm Hiatus:  40 cm Stomach:  Stomach was empty and distended very well with insufflation. Folds in the proximal stomach were normal. Examination of mucosa at gastric body, antrum, pyloric channel, angularis fundus and cardia was normal. Duodenum:  Patchy erythema and edema noted to bulbar mucosa without erosions  or ulcers. Post bulbar duodenal mucosa was normal.  Therapeutic/Diagnostic Maneuvers Performed:   none  Complications:  None  EBL: None  Impression: Small sliding hiatal hernia with mild changes of reflux esophagitis limited to GE junction. Non-erosive bulbar duodenitis no evidence of peptic ulcer disease.  Recommendations:  Standard instructions given. Patient will continue anti-reflux measures and pantoprazole as before. H. Pylori serology. Upper abdominal ultrasound to be scheduled.  REHMAN,NAJEEB U  10/16/2015  12:31 PM  CC: Dr. Carylon PerchesFAGAN,ROY, MD & Dr. Bonnetta BarryNo ref. provider found

## 2015-10-16 NOTE — Discharge Instructions (Signed)
Resume usual medications and diet.  No driving for 24 hours.  Upper abdominal ultrasound to be scheduled. Office will call Physician will call with blood results.       Esophagogastroduodenoscopy, Care After Refer to this sheet in the next few weeks. These instructions provide you with information about caring for yourself after your procedure. Your health care provider may also give you more specific instructions. Your treatment has been planned according to current medical practices, but problems sometimes occur. Call your health care provider if you have any problems or questions after your procedure. WHAT TO EXPECT AFTER THE PROCEDURE After your procedure, it is typical to feel:  Soreness in your throat.  Pain with swallowing.  Sick to your stomach (nauseous).  Bloated.  Dizzy.  Fatigued. HOME CARE INSTRUCTIONS  Do not eat or drink anything until the numbing medicine (local anesthetic) has worn off and your gag reflex has returned. You will know that the local anesthetic has worn off when you can swallow comfortably.  Do not drive or operate machinery until directed by your health care provider.  Take medicines only as directed by your health care provider. SEEK MEDICAL CARE IF:   You cannot stop coughing.  You are not urinating at all or less than usual. SEEK IMMEDIATE MEDICAL CARE IF:  You have difficulty swallowing.  You cannot eat or drink.  You have worsening throat or chest pain.  You have dizziness or lightheadedness or you faint.  You have nausea or vomiting.  You have chills.  You have a fever.  You have severe abdominal pain.  You have black, tarry, or bloody stools.   This information is not intended to replace advice given to you by your health care provider. Make sure you discuss any questions you have with your health care provider.   Document Released: 07/18/2012 Document Revised: 08/22/2014 Document Reviewed: 07/18/2012 Elsevier  Interactive Patient Education 2016 [employer] Inc[employer]    Hiatal Hernia A hiatal hernia occurs when part of your stomach slides above the muscle that separates your abdomen from your chest (diaphragm). You can be born with a hiatal hernia (congenital), or it may develop over time. In almost all cases of hiatal hernia, only the top part of the stomach pushes through.  Many people have a hiatal hernia with no symptoms. The larger the hernia, the more likely that you will have symptoms. In some cases, a hiatal hernia allows stomach acid to flow back into the tube that carries food from your mouth to your stomach (esophagus). This may cause heartburn symptoms. Severe heartburn symptoms may mean you have developed a condition called gastroesophageal reflux disease (GERD).  CAUSES  Hiatal hernias are caused by a weakness in the opening (hiatus) where your esophagus passes through your diaphragm to attach to the upper part of your stomach. You may be born with a weakness in your hiatus, or a weakness can develop. RISK FACTORS Older age is a major risk factor for a hiatal hernia. Anything that increases pressure on your diaphragm can also increase your risk of a hiatal hernia. This includes:  Pregnancy.  Excess weight.  Frequent constipation. SIGNS AND SYMPTOMS  People with a hiatal hernia often have no symptoms. If symptoms develop, they are almost always caused by GERD. They may include:  Heartburn.  Belching.  Indigestion.  Trouble swallowing.  Coughing or wheezing.  Sore throat.  Hoarseness.  Chest pain. DIAGNOSIS  A hiatal hernia is sometimes found during an exam for another problem.  Your health care provider may suspect a hiatal hernia if you have symptoms of GERD. Tests may be done to diagnose GERD. These may include:  X-rays of your stomach or chest.  An upper gastrointestinal (GI) series. This is an X-ray exam of your GI tract involving the use of a chalky liquid that you  swallow. The liquid shows up clearly on the X-ray.  Endoscopy. This is a procedure to look into your stomach using a thin, flexible tube that has a tiny camera and light on the end of it. TREATMENT  If you have no symptoms, you may not need treatment. If you have symptoms, treatment may include:  Dietary and lifestyle changes to help reduce GERD symptoms.  Medicines. These may include:  Over-the-counter antacids.  Medicines that make your stomach empty more quickly.  Medicines that block the production of stomach acid (H2 blockers).  Stronger medicines to reduce stomach acid (proton pump inhibitors).  You may need surgery to repair the hernia if other treatments are not helping. HOME CARE INSTRUCTIONS   Take all medicines as directed by your health care provider.  Quit smoking, if you smoke.  Try to achieve and maintain a healthy body weight.  Eat frequent small meals instead of three large meals a day. This keeps your stomach from getting too full.  Eat slowly.  Do not lie down right after eating.  Do noteat 1-2 hours before bed.   Do not drink beverages with caffeine. These include cola, coffee, cocoa, and tea.  Do not drink alcohol.  Avoid foods that can make symptoms of GERD worse. These may include:  Fatty foods.  Citrus fruits.  Other foods and drinks that contain acid.  Avoid putting pressure on your belly. Anything that puts pressure on your belly increases the amount of acid that may be pushed up into your esophagus.   Avoid bending over, especially after eating.  Raise the head of your bed by putting blocks under the legs. This keeps your head and esophagus higher than your stomach.  Do not wear tight clothing around your chest or stomach.  Try not to strain when having a bowel movement, when urinating, or when lifting heavy objects. SEEK MEDICAL CARE IF:  Your symptoms are not controlled with medicines or lifestyle changes.  You are having  trouble swallowing.  You have coughing or wheezing that will not go away. SEEK IMMEDIATE MEDICAL CARE IF:  Your pain is getting worse.  Your pain spreads to your arms, neck, jaw, teeth, or back.  You have shortness of breath.  You sweat for no reason.  You feel sick to your stomach (nauseous) or vomit.  You vomit blood.  You have bright red blood in your stools.  You have black, tarry stools.    This information is not intended to replace advice given to you by your health care provider. Make sure you discuss any questions you have with your health care provider.   Document Released: 10/22/2003 Document Revised: 08/22/2014 Document Reviewed: 07/19/2013 Elsevier Interactive Patient Education [employer]

## 2015-10-16 NOTE — H&P (Signed)
Kimberly Meyers is an 50 y.o. female.   Chief Complaint:  Patient is here for diagnostic EGD. HPI:  Patient is 50 year old Caucasian female who presents with few months history of epigastric pain as well as chest and back pain. She has nausea with sporadic vomiting. She does not experience heartburn. She says she has multiple EKGs and they've always been normal. Symptoms got worse after she quit cigarette smoking 4 months ago. She also complains of having lump in her throat. She's had this symptom for 1 year. She saw Dr. Suszanne Conners and was treated for yeast infection but her symptoms have not improved. He used to take BC/Goody powder but stopped taking for 5 years ago. Takes Advil but only occasionally. There is no history of melena or rectal bleeding. She has intermittent diarrhea and she does better when she takes fiber supplement. She has been on PPI for two weeks but cannot tell any difference.  Last ultrasound was in April 2015 and was negative for cholelithiasis.  She drinks alcohol socially. She says sometimes she drinks too much like over the weekend she had 6 drinks on Saturday night.  Past Medical History  Diagnosis Date  . GERD (gastroesophageal reflux disease)   . Complication of anesthesia   . PONV (postoperative nausea and vomiting)   . Medical history non-contributory     Past Surgical History  Procedure Laterality Date  . Hip surgery      torn cartilidge repair.  (left)  . Diagnostic laparoscopy      foe endometriosis  . Colonoscopy N/A 06/27/2014    Procedure: COLONOSCOPY;  Surgeon: Malissa Hippo, MD;  [facility];  Service: Endoscopy;  Laterality: N/A;  830    Family History  Problem Relation Age of Onset  . Cancer Mother    Social History:  reports that she has been smoking.  She does not have any smokeless tobacco history on file. She reports that she drinks alcohol. She reports that she does not use illicit drugs.  Allergies:  Allergies  Allergen  Reactions  . Penicillins Hives    Has patient had a PCN reaction causing immediate rash, facial/tongue/throat swelling, SOB or lightheadedness with hypotension: unknown Has patient had a PCN reaction causing severe rash involving mucus membranes or skin necrosis: unknown Has patient had a PCN reaction that required hospitalization: unknown Has patient had a PCN reaction occurring within the last 10 years: unknown If all of the above answers are "NO", then may proceed with Cephalosporin use.   . Sulfa Antibiotics     Hives, itching    Medications Prior to Admission  Medication Sig Dispense Refill  . clonazePAM (KLONOPIN) 0.5 MG tablet Take 0.5 mg by mouth 2 (two) times daily as needed for anxiety.    . pantoprazole (PROTONIX) 40 MG tablet Take 40 mg by mouth 2 (two) times daily.     . Wheat Dextrin (BENEFIBER DRINK MIX) PACK Take 4 g by mouth at bedtime. (Patient taking differently: Take 4 g by mouth daily as needed (stomach). )      No results found for this or any previous visit (from the past 48 hour(s)). No results found.  ROS  Blood pressure 145/89, pulse 64, temperature 97.8 F (36.6 C), temperature source Oral, resp. rate 18, SpO2 100 %. Physical Exam  Constitutional: She appears well-developed and well-nourished.  HENT:  Mouth/Throat: Oropharynx is clear and moist.  Eyes: Conjunctivae are normal. No scleral icterus.  Neck: No thyromegaly present.  Cardiovascular:  Normal rate, regular rhythm and normal heart sounds.   No murmur heard. Respiratory: Effort normal and breath sounds normal.  GI: Soft. There is tenderness ( mild epigastric tenderness).  Musculoskeletal: She exhibits no edema.  Lymphadenopathy:    She has no cervical adenopathy.  Neurological: She is alert.  Skin: Skin is warm and dry.     Assessment/Plan  Epigastric and chest pain.  Nausea with sporadic vomiting.  Diagnostic EGD.  Malissa HippoEHMAN,Odis Wickey U, MD 10/16/2015, 12:10 PM

## 2015-10-17 LAB — H. PYLORI ANTIBODY, IGG: H Pylori IgG: <0.9 U/mL (ref 0.0–0.8)

## 2015-10-19 ENCOUNTER — Other Ambulatory Visit (INDEPENDENT_AMBULATORY_CARE_PROVIDER_SITE_OTHER): Payer: Self-pay | Admitting: *Deleted

## 2015-10-19 DIAGNOSIS — R1013 Epigastric pain: Principal | ICD-10-CM

## 2015-10-19 DIAGNOSIS — R112 Nausea with vomiting, unspecified: Secondary | ICD-10-CM

## 2015-10-19 DIAGNOSIS — G8929 Other chronic pain: Secondary | ICD-10-CM

## 2015-10-21 ENCOUNTER — Encounter (HOSPITAL_COMMUNITY): Payer: Self-pay | Admitting: Internal Medicine

## 2015-10-23 ENCOUNTER — Ambulatory Visit (HOSPITAL_COMMUNITY)
Admission: RE | Admit: 2015-10-23 | Discharge: 2015-10-23 | Disposition: A | Discharge status: Home / Self Care | Payer: BLUE CROSS/BLUE SHIELD | Source: Ambulatory Visit | Attending: Internal Medicine | Admitting: Internal Medicine

## 2015-10-23 DIAGNOSIS — R1013 Epigastric pain: Secondary | ICD-10-CM | POA: Diagnosis not present

## 2015-10-23 DIAGNOSIS — R112 Nausea with vomiting, unspecified: Secondary | ICD-10-CM | POA: Insufficient documentation

## 2015-10-23 DIAGNOSIS — G8929 Other chronic pain: Secondary | ICD-10-CM | POA: Diagnosis not present

## 2015-10-26 ENCOUNTER — Other Ambulatory Visit (INDEPENDENT_AMBULATORY_CARE_PROVIDER_SITE_OTHER): Payer: Self-pay | Admitting: Internal Medicine

## 2015-10-26 DIAGNOSIS — R112 Nausea with vomiting, unspecified: Secondary | ICD-10-CM

## 2015-10-26 DIAGNOSIS — R1013 Epigastric pain: Principal | ICD-10-CM

## 2015-10-26 DIAGNOSIS — G8929 Other chronic pain: Secondary | ICD-10-CM

## 2015-11-02 ENCOUNTER — Encounter (HOSPITAL_COMMUNITY): Payer: Self-pay

## 2015-11-02 ENCOUNTER — Encounter (HOSPITAL_COMMUNITY)
Admission: RE | Admit: 2015-11-02 | Discharge: 2015-11-02 | Disposition: A | Discharge status: Home / Self Care | Payer: BLUE CROSS/BLUE SHIELD | Source: Ambulatory Visit | Attending: Internal Medicine | Admitting: Internal Medicine

## 2015-11-02 DIAGNOSIS — G8929 Other chronic pain: Secondary | ICD-10-CM | POA: Diagnosis present

## 2015-11-02 DIAGNOSIS — R1013 Epigastric pain: Secondary | ICD-10-CM | POA: Insufficient documentation

## 2015-11-02 DIAGNOSIS — R112 Nausea with vomiting, unspecified: Secondary | ICD-10-CM | POA: Insufficient documentation

## 2015-11-02 MED ORDER — TECHNETIUM TC 99M MEBROFENIN IV KIT
5.0000 | PACK | Freq: Once | INTRAVENOUS | Status: AC | PRN
  Administered 2015-11-02: 5 via INTRAVENOUS

## 2015-11-02 MED ORDER — SINCALIDE 5 MCG IJ SOLR
INTRAMUSCULAR | Status: AC
  Filled 2015-11-02: qty 5

## 2015-11-02 MED ORDER — STERILE WATER FOR INJECTION IJ SOLN
INTRAMUSCULAR | Status: AC
  Filled 2015-11-02: qty 10

## 2016-06-15 ENCOUNTER — Other Ambulatory Visit (HOSPITAL_BASED_OUTPATIENT_CLINIC_OR_DEPARTMENT_OTHER): Payer: Self-pay

## 2016-06-15 DIAGNOSIS — G4730 Sleep apnea, unspecified: Secondary | ICD-10-CM

## 2016-06-22 ENCOUNTER — Ambulatory Visit (HOSPITAL_COMMUNITY)
Admission: RE | Admit: 2016-06-22 | Discharge: 2016-06-22 | Disposition: A | Discharge status: Home / Self Care | Payer: BLUE CROSS/BLUE SHIELD | Source: Ambulatory Visit | Attending: Pulmonary Disease | Admitting: Pulmonary Disease

## 2016-06-22 DIAGNOSIS — R002 Palpitations: Secondary | ICD-10-CM

## 2016-06-22 LAB — ECHOCARDIOGRAM COMPLETE
E decel time: 271 msec
E/e' ratio: 6.77
FS: 35 % (ref 28–44)
IVS/LV PW RATIO, ED: 0.88
LA ID, A-P, ES: 31 mm
LA diam end sys: 31 mm
LA diam index: 1.83 cm/m2
LA vol A4C: 40.5 ml
LA vol index: 23.5 mL/m2
LA vol: 39.8 mL
LV E/e' medial: 6.77
LV E/e'average: 6.77
LV PW d: 9.72 mm — AB (ref 0.6–1.1)
LV dias vol index: 38 mL/m2
LV dias vol: 64 mL (ref 46–106)
LV e' LATERAL: 9.9 cm/s
LV sys vol index: 15 mL/m2
LV sys vol: 25 mL (ref 14–42)
LVOT SV: 58 mL
LVOT VTI: 20.4 cm
LVOT area: 2.84 cm2
LVOT diameter: 19 mm
LVOT peak grad rest: 4 mmHg
LVOT peak vel: 101 cm/s
Lateral S' vel: 11.4 cm/s
MV Dec: 271
MV pk A vel: 45.6 m/s
MV pk E vel: 67 m/s
RV sys press: 20 mmHg
Reg peak vel: 206 cm/s
Simpson's disk: 61
Stroke v: 39 ml
TAPSE: 22.3 mm
TDI e' lateral: 9.9
TDI e' medial: 9.25
TR max vel: 206 cm/s

## 2016-06-22 NOTE — Progress Notes (Signed)
*  PRELIMINARY RESULTS* Echocardiogram 2D Echocardiogram has been performed.  Stacey DrainWhite, Vaani Morren J 06/22/2016, 9:21 AM

## 2016-06-23 ENCOUNTER — Ambulatory Visit: Payer: BLUE CROSS/BLUE SHIELD | Attending: Pulmonary Disease | Admitting: Neurology

## 2016-06-23 DIAGNOSIS — G4730 Sleep apnea, unspecified: Secondary | ICD-10-CM | POA: Insufficient documentation

## 2016-07-06 NOTE — Procedures (Signed)
   [facility] Astha Probasco A. Gerilyn Pilgrimoonquah, MD     [email or web address]             NOCTURNAL POLYSOMNOGRAPHY   LOCATION: [facility]  Patient Name: Kimberly Meyers, Kimberly Meyers Study Date: 06/23/2016 Gender: Female D.O.B: 1965-08-23 Age (years): 50 Referring Provider: Mikey BussingEdwards Hawkins Height (inches): 67 Interpreting Physician: Beryle BeamsKofi Phyllistine Domingos MD, ABSM Weight (lbs): 152 RPSGT: Peak, Robert BMI: 24 MRN: [id number] Neck Size: 13.00 CLINICAL INFORMATION Sleep Study Type: NPSG Indication for sleep study: N/A Epworth Sleepiness Score: 13 SLEEP STUDY TECHNIQUE As per the AASM Manual for the Scoring of Sleep and Associated Events v2.3 (April 2016) with a hypopnea requiring 4% desaturations. The channels recorded and monitored were frontal, central and occipital EEG, electrooculogram (EOG), submentalis EMG (chin), nasal and oral airflow, thoracic and abdominal wall motion, anterior tibialis EMG, snore microphone, electrocardiogram, and pulse oximetry. MEDICATIONS Medications self-administered by patient taken the night of the study : N/A  Current Outpatient Prescriptions:  .  clonazePAM (KLONOPIN) 0.5 MG tablet, Take 0.5 mg by mouth 2 (two) times daily as needed for anxiety., Disp: , Rfl:  .  pantoprazole (PROTONIX) 40 MG tablet, Take 40 mg by mouth 2 (two) times daily. , Disp: , Rfl:  .  Wheat Dextrin (BENEFIBER DRINK MIX) PACK, Take 4 g by mouth at bedtime. (Patient taking differently: Take 4 g by mouth daily as needed (stomach). ), Disp: , Rfl:   SLEEP ARCHITECTURE The study was initiated at 10:58:21 PM and ended at 5:09:31 AM. Sleep onset time was 23.2 minutes and the sleep efficiency was 88.1%. The total sleep time was 326.9 minutes. Stage REM latency was 130.5 minutes. The patient spent 3.19% of the night in stage N1 sleep, 54.14% in stage N2 sleep, 26.76% in stage N3 and 15.91% in REM. Alpha intrusion was absent. Supine sleep was 35.18%. RESPIRATORY PARAMETERS The overall  apnea/hypopnea index (AHI) was 1.8 per hour. There were 4 total apneas, including 0 obstructive, 4 central and 0 mixed apneas. There were 6 hypopneas and 17 RERAs. The AHI during Stage REM sleep was 0.0 per hour. AHI while supine was 2.6 per hour. The mean oxygen saturation was 96.21%. The minimum SpO2 during sleep was 91.00%. Moderate snoring was noted during this study. CARDIAC DATA The 2 lead EKG demonstrated sinus rhythm. The mean heart rate was 57.03 beats per minute. Other EKG findings include: None. LEG MOVEMENT DATA The total PLMS were 0 with a resulting PLMS index of 0.00. Associated arousal with leg movement index was 0.0.   IMPRESSIONS - This study is unremarkable.   Kimberly RammingKofi A Henley Boettner, MD Diplomate, American Board of Sleep Medicine.

## 2017-09-11 ENCOUNTER — Ambulatory Visit (HOSPITAL_COMMUNITY)
Admission: RE | Admit: 2017-09-11 | Discharge: 2017-09-11 | Disposition: A | Discharge status: Home / Self Care | Payer: PRIVATE HEALTH INSURANCE | Source: Ambulatory Visit | Attending: Internal Medicine | Admitting: Internal Medicine

## 2017-09-11 ENCOUNTER — Other Ambulatory Visit (HOSPITAL_COMMUNITY): Payer: Self-pay | Admitting: Internal Medicine

## 2017-09-11 DIAGNOSIS — R0781 Pleurodynia: Secondary | ICD-10-CM | POA: Insufficient documentation

## 2017-09-11 DIAGNOSIS — R0789 Other chest pain: Secondary | ICD-10-CM

## 2017-11-15 ENCOUNTER — Other Ambulatory Visit (HOSPITAL_COMMUNITY): Payer: Self-pay | Admitting: Internal Medicine

## 2017-11-15 DIAGNOSIS — R079 Chest pain, unspecified: Secondary | ICD-10-CM

## 2017-11-27 ENCOUNTER — Ambulatory Visit (HOSPITAL_COMMUNITY)
Admission: RE | Admit: 2017-11-27 | Discharge: 2017-11-27 | Disposition: A | Discharge status: Home / Self Care | Payer: PRIVATE HEALTH INSURANCE | Source: Ambulatory Visit | Attending: Internal Medicine | Admitting: Internal Medicine

## 2017-11-27 DIAGNOSIS — R079 Chest pain, unspecified: Secondary | ICD-10-CM | POA: Insufficient documentation

## 2017-11-27 DIAGNOSIS — I700 Atherosclerosis of aorta: Secondary | ICD-10-CM | POA: Insufficient documentation

## 2018-05-11 ENCOUNTER — Other Ambulatory Visit (HOSPITAL_COMMUNITY): Payer: Self-pay | Admitting: Obstetrics and Gynecology

## 2018-05-11 DIAGNOSIS — Z1231 Encounter for screening mammogram for malignant neoplasm of breast: Secondary | ICD-10-CM

## 2018-05-21 ENCOUNTER — Ambulatory Visit (HOSPITAL_COMMUNITY)
Admission: RE | Admit: 2018-05-21 | Discharge: 2018-05-21 | Disposition: A | Discharge status: Home / Self Care | Payer: PRIVATE HEALTH INSURANCE | Source: Ambulatory Visit | Attending: Obstetrics and Gynecology | Admitting: Obstetrics and Gynecology

## 2018-05-21 ENCOUNTER — Encounter (HOSPITAL_COMMUNITY): Payer: Self-pay

## 2018-05-21 DIAGNOSIS — Z1231 Encounter for screening mammogram for malignant neoplasm of breast: Secondary | ICD-10-CM | POA: Insufficient documentation

## 2018-05-23 ENCOUNTER — Encounter (HOSPITAL_COMMUNITY): Payer: Self-pay

## 2018-05-23 ENCOUNTER — Ambulatory Visit (HOSPITAL_COMMUNITY)
Admission: RE | Admit: 2018-05-23 | Discharge: 2018-05-23 | Disposition: A | Discharge status: Home / Self Care | Payer: PRIVATE HEALTH INSURANCE | Source: Ambulatory Visit | Attending: Obstetrics and Gynecology | Admitting: Obstetrics and Gynecology

## 2018-05-23 DIAGNOSIS — Z1231 Encounter for screening mammogram for malignant neoplasm of breast: Secondary | ICD-10-CM | POA: Diagnosis present

## 2019-02-11 ENCOUNTER — Other Ambulatory Visit: Payer: Self-pay

## 2019-02-11 ENCOUNTER — Ambulatory Visit
Admission: EM | Admit: 2019-02-11 | Discharge: 2019-02-11 | Disposition: A | Discharge status: Home / Self Care | Payer: PRIVATE HEALTH INSURANCE

## 2019-02-11 DIAGNOSIS — R21 Rash and other nonspecific skin eruption: Secondary | ICD-10-CM

## 2019-02-11 DIAGNOSIS — L247 Irritant contact dermatitis due to plants, except food: Secondary | ICD-10-CM

## 2019-02-11 MED ORDER — DEXAMETHASONE SODIUM PHOSPHATE 10 MG/ML IJ SOLN
10.0000 mg | Freq: Once | INTRAMUSCULAR | Status: AC
  Administered 2019-02-11: 10 mg via INTRAMUSCULAR

## 2019-02-11 NOTE — ED Provider Notes (Signed)
[facility]   [id number] 02/11/19 Arrival Time: 1245  CC: SKIN COMPLAINT  SUBJECTIVE:  Kimberly Meyers is a 53 y.o. female hx significant for GERD, who presents with a rash to bilateral arms x 1-2 days.  Symptoms began after working in the yard.  Localizes the rash to RT arms.  Describes it as red and itchy.  Has tried OTC hydrocortisone cream without relief.  Symptoms are made worse with itching.  Reports similar symptoms in the past that improved with steroid shot.  Reports becoming "irritable" with prednisone. Denies fever, chills, nausea, vomiting, ,discharge, SOB, chest pain, abdominal pain, changes in bowel or bladder function.    ROS: As per HPI.  Past Medical History:  Diagnosis Date  . Complication of anesthesia   . GERD (gastroesophageal reflux disease)   . Medical history non-contributory   . PONV (postoperative nausea and vomiting)    Past Surgical History:  Procedure Laterality Date  . COLONOSCOPY N/A 06/27/2014   Procedure: COLONOSCOPY;  Surgeon: Malissa HippoNajeeb U Rehman, MD;  [facility]: [facility];  Service: Endoscopy;  Laterality: N/A;  830  . DIAGNOSTIC LAPAROSCOPY     foe endometriosis  . ESOPHAGOGASTRODUODENOSCOPY N/A 10/16/2015   Procedure: ESOPHAGOGASTRODUODENOSCOPY (EGD);  Surgeon: Malissa HippoNajeeb U Rehman, MD;  [facility];  Service: Endoscopy;  Laterality: N/A;  1240  . HIP SURGERY     torn cartilidge repair.  (left)   Allergies  Allergen Reactions  . Penicillins Hives    Has patient had a PCN reaction causing immediate rash, facial/tongue/throat swelling, SOB or lightheadedness with hypotension: unknown Has patient had a PCN reaction causing severe rash involving mucus membranes or skin necrosis: unknown Has patient had a PCN reaction that required hospitalization: unknown Has patient had a PCN reaction occurring within the last 10 years: unknown If all of the above answers are "NO", then may proceed with Cephalosporin use.   . Prednisone    Makes irritable  . Sulfa Antibiotics     Hives, itching   No current facility-administered medications on file prior to encounter.    Current Outpatient Medications on File Prior to Encounter  Medication Sig Dispense Refill  . escitalopram (LEXAPRO) 5 MG tablet Take 5 mg by mouth daily.    . clonazePAM (KLONOPIN) 0.5 MG tablet Take 0.5 mg by mouth 2 (two) times daily as needed for anxiety.    . Wheat Dextrin (BENEFIBER DRINK MIX) PACK Take 4 g by mouth at bedtime. (Patient taking differently: Take 4 g by mouth daily as needed (stomach). )    . [DISCONTINUED] pantoprazole (PROTONIX) 40 MG tablet Take 40 mg by mouth 2 (two) times daily.      Social History   Socioeconomic History  . Marital status: Married    Spouse name: Not on file  . Number of children: Not on file  . Years of education: Not on file  . Highest education level: Not on file  Occupational History  . Not on file  Social Needs  . Financial resource strain: Not on file  . Food insecurity    Worry: Not on file    Inability: Not on file  . Transportation needs    Medical: Not on file    Non-medical: Not on file  Tobacco Use  . Smoking status: Current Every Day Smoker  . Tobacco comment: 1 pack a day x 30 yrs.   Substance and Sexual Activity  . Alcohol use: Yes    Alcohol/week: 0.0 standard drinks  Comment: 8 beers a week  . Drug use: No  . Sexual activity: Not on file  Lifestyle  . Physical activity    Days per week: Not on file    Minutes per session: Not on file  . Stress: Not on file  Relationships  . Social Herbalist on phone: Not on file    Gets together: Not on file    Attends religious service: Not on file    Active member of club or organization: Not on file    Attends meetings of clubs or organizations: Not on file    Relationship status: Not on file  . Intimate partner violence    Fear of current or ex partner: Not on file    Emotionally abused: Not on file    Physically abused:  Not on file    Forced sexual activity: Not on file  Other Topics Concern  . Not on file  Social History Narrative  . Not on file   Family History  Problem Relation Age of Onset  . Cancer Mother     OBJECTIVE: Vitals:   02/11/19 1258  BP: 121/78  Pulse: 71  Resp: 18  Temp: 98.9 F (37.2 C)  SpO2: 97%    General appearance: alert; no distress Head: NCAT Lungs: clear to auscultation bilaterally Heart: regular rate and rhythm.  Radial pulse 2+ bilaterally Extremities: no edema Skin: warm and dry; area of linear papules and vesicles with surrounding erythema localized to RT medial forearm, mild involvement of LT medial upper arm; no obvious drainage or bleeding Psychological: alert and cooperative; normal mood and affect  ASSESSMENT & PLAN:  1. Irritant contact dermatitis due to plants, except food     Meds ordered this encounter  Medications  . dexamethasone (DECADRON) injection 10 mg   Decadron shot given in office Wash with warm water and mild soap Continue with hydrocortisone cream Use OTC antihistamine as needed for itching Follow up with PCP if symptoms persists Return or go to the ED if you have any new or worsening symptoms such as fever, chills, nausea, vomiting, increased redness, swelling, discharge, etc...  Reviewed expectations re: course of current medical issues. Questions answered. Outlined signs and symptoms indicating need for more acute intervention. Patient verbalized understanding. After Visit Summary given.   Lestine Box, PA-C 02/11/19 1313

## 2019-02-11 NOTE — Discharge Instructions (Signed)
Decadron shot given in office Wash with warm water and mild soap Continue with hydrocortisone cream Use OTC antihistamine as needed for itching Follow up with PCP if symptoms persists Return or go to the ED if you have any new or worsening symptoms such as fever, chills, nausea, vomiting, increased redness, swelling, discharge, etc..Marland Kitchen

## 2019-02-11 NOTE — ED Triage Notes (Signed)
Rash on arms, pt was working in yard

## 2019-05-21 ENCOUNTER — Encounter (HOSPITAL_COMMUNITY): Payer: Self-pay | Admitting: Emergency Medicine

## 2019-05-21 ENCOUNTER — Emergency Department (HOSPITAL_COMMUNITY): Payer: Self-pay

## 2019-05-21 ENCOUNTER — Emergency Department (HOSPITAL_COMMUNITY)
Admission: EM | Admit: 2019-05-21 | Discharge: 2019-05-21 | Disposition: A | Discharge status: Home / Self Care | Payer: Self-pay | Attending: Emergency Medicine | Admitting: Emergency Medicine

## 2019-05-21 ENCOUNTER — Other Ambulatory Visit: Payer: Self-pay

## 2019-05-21 DIAGNOSIS — Z79899 Other long term (current) drug therapy: Secondary | ICD-10-CM | POA: Insufficient documentation

## 2019-05-21 DIAGNOSIS — R0789 Other chest pain: Secondary | ICD-10-CM | POA: Insufficient documentation

## 2019-05-21 DIAGNOSIS — F17200 Nicotine dependence, unspecified, uncomplicated: Secondary | ICD-10-CM | POA: Insufficient documentation

## 2019-05-21 HISTORY — DX: Diaphragmatic hernia without obstruction or gangrene: K44.9

## 2019-05-21 HISTORY — DX: Duodenitis without bleeding: K29.80

## 2019-05-21 LAB — CBC
HCT: 40.2 % (ref 36.0–46.0)
Hemoglobin: 12.9 g/dL (ref 12.0–15.0)
MCH: 29.5 pg (ref 26.0–34.0)
MCHC: 32.1 g/dL (ref 30.0–36.0)
MCV: 92 fL (ref 80.0–100.0)
Platelets: 242 10*3/uL (ref 150–400)
RBC: 4.37 MIL/uL (ref 3.87–5.11)
RDW: 12.7 % (ref 11.5–15.5)
WBC: 6.4 10*3/uL (ref 4.0–10.5)
nRBC: 0 % (ref 0.0–0.2)

## 2019-05-21 LAB — BASIC METABOLIC PANEL
Anion gap: 8 (ref 5–15)
BUN: 17 mg/dL (ref 6–20)
CO2: 26 mmol/L (ref 22–32)
Calcium: 9.5 mg/dL (ref 8.9–10.3)
Chloride: 105 mmol/L (ref 98–111)
Creatinine, Ser: 0.62 mg/dL (ref 0.44–1.00)
GFR calc Af Amer: >60 mL/min (ref >60)
GFR calc non Af Amer: >60 mL/min (ref >60)
Glucose, Bld: 100 mg/dL — ABNORMAL HIGH (ref 70–99)
Potassium: 3.5 mmol/L (ref 3.5–5.1)
Sodium: 139 mmol/L (ref 135–145)

## 2019-05-21 LAB — TROPONIN I (HIGH SENSITIVITY)
Troponin I (High Sensitivity): <2 ng/L (ref <18)
Troponin I (High Sensitivity): <2 ng/L (ref <18)

## 2019-05-21 LAB — D-DIMER, QUANTITATIVE: D-Dimer, Quant: <0.27 ug/mL-FEU (ref 0.00–0.50)

## 2019-05-21 MED ORDER — ALUM & MAG HYDROXIDE-SIMETH 200-200-20 MG/5ML PO SUSP
30.0000 mL | Freq: Once | ORAL | Status: AC
  Administered 2019-05-21: 13:00:00 30 mL via ORAL
  Filled 2019-05-21: qty 30

## 2019-05-21 MED ORDER — SODIUM CHLORIDE 0.9% FLUSH: 3.0000 mL | Freq: Once | INTRAVENOUS | Status: DC

## 2019-05-21 MED ORDER — ASPIRIN 81 MG PO CHEW
324.0000 mg | CHEWABLE_TABLET | Freq: Once | ORAL | Status: AC
  Administered 2019-05-21: 13:00:00 324 mg via ORAL
  Filled 2019-05-21: qty 4

## 2019-05-21 MED ORDER — LIDOCAINE VISCOUS HCL 2 % MT SOLN
15.0000 mL | Freq: Once | OROMUCOSAL | Status: AC
  Administered 2019-05-21: 15 mL via ORAL
  Filled 2019-05-21: qty 15

## 2019-05-21 NOTE — ED Triage Notes (Signed)
Pt c/o of sharp pain shooting between her shoulder blades starting at 0700 this morning.  Went to urgent care and was sent to ED for eval

## 2019-05-21 NOTE — Discharge Instructions (Addendum)
Take your usual prescriptions as directed.  Apply moist heat or ice to the area(s) of discomfort, for 15 minutes at a time, several times per day for the next few days.  Do not fall asleep on a heating or ice pack.  Call your regular medical doctor today to schedule a follow up appointment this week.  Return to the Emergency Department immediately if worsening.

## 2019-05-21 NOTE — ED Provider Notes (Signed)
[facility] EMERGENCY DEPARTMENT Provider Note   CSN: [id number] Arrival date & time: 05/21/19  1215     History   Chief Complaint Chief Complaint  Patient presents with   Chest Pain    HPI Kimberly Meyers is a 53 y.o. female.     HPI  Pt was seen at 1255.  Per pt, c/o gradual onset and persistence of waxing and waning lower mid-sternal chest "pain" that began 3 days ago. Pt describes the CP has "aching" and "like it's on the surface not deep," lasting only a few seconds to minute each episode. Episodes occur randomly and without relation to activity. Pt states this morning at 0700 she developed one brief episode of "pain" between her shoulder blades, so she came to the ED for evaluation. Denies palpitations, no SOB/cough, no abd pain, no N/V/D, no rash, no fevers, no injury.   Past Medical History:  Diagnosis Date   Complication of anesthesia    Duodenitis    GERD (gastroesophageal reflux disease)    Hiatal hernia    PONV (postoperative nausea and vomiting)     Patient Active Problem List   Diagnosis Date Noted   Guaiac positive stools 06/16/2014   Change in stool 06/16/2014    Past Surgical History:  Procedure Laterality Date   COLONOSCOPY N/A 06/27/2014   Procedure: COLONOSCOPY;  Surgeon: Malissa Hippo, MD;  [facility];  Service: Endoscopy;  Laterality: N/A;  830   DIAGNOSTIC LAPAROSCOPY     foe endometriosis   ESOPHAGOGASTRODUODENOSCOPY N/A 10/16/2015   Procedure: ESOPHAGOGASTRODUODENOSCOPY (EGD);  Surgeon: Malissa Hippo, MD;  [facility];  Service: Endoscopy;  Laterality: N/A;  1240   HIP SURGERY     torn cartilidge repair.  (left)     OB History   No obstetric history on file.      Home Medications    Prior to Admission medications   Medication Sig Start Date End Date Taking? Authorizing Provider  clonazePAM (KLONOPIN) 0.5 MG tablet Take 0.5 mg by mouth 2 (two) times daily as needed for anxiety.    [provider]  escitalopram (LEXAPRO) 5 MG tablet Take 5 mg by mouth daily.    [provider]  Wheat Dextrin (BENEFIBER DRINK MIX) PACK Take 4 g by mouth at bedtime. Patient taking differently: Take 4 g by mouth daily as needed (stomach).  06/27/14   Rehman, Joline Maxcy, MD  pantoprazole (PROTONIX) 40 MG tablet Take 40 mg by mouth 2 (two) times daily.   02/11/19  [provider]    Family History Family History  Problem Relation Age of Onset   Cancer Mother     Social History Social History   Tobacco Use   Smoking status: Current Every Day Smoker   Smokeless tobacco: Never Used   Tobacco comment: 1 pack a day x 30 yrs.   Substance Use Topics   Alcohol use: Yes    Alcohol/week: 0.0 standard drinks    Comment: 8 beers a week   Drug use: No     Allergies   Penicillins, Prednisone, and Sulfa antibiotics   Review of Systems Review of Systems ROS: Statement: All systems negative except as marked or noted in the HPI; Constitutional: Negative for fever and chills. ; ; Eyes: Negative for eye pain, redness and discharge. ; ; ENMT: Negative for ear pain, hoarseness, nasal congestion, sinus pressure and sore throat. ; ; Cardiovascular: +CP. Negative for palpitations, diaphoresis, dyspnea and peripheral  edema. ; ; Respiratory: Negative for cough, wheezing and stridor. ; ; Gastrointestinal: Negative for nausea, vomiting, diarrhea, abdominal pain, blood in stool, hematemesis, jaundice and rectal bleeding. . ; ; Genitourinary: Negative for dysuria, flank pain and hematuria. ; ; Musculoskeletal: +mid-back pain. Negative for neck pain. Negative for swelling and trauma.; ; Skin: Negative for pruritus, rash, abrasions, blisters, bruising and skin lesion.; ; Neuro: Negative for headache, lightheadedness and neck stiffness. Negative for weakness, altered level of consciousness, altered mental status, extremity weakness, paresthesias, involuntary movement, seizure and syncope.        Physical Exam Updated Vital Signs BP 123/85    Pulse 67    Resp 20    Ht 5\' 7"  (1.702 m)    Wt 66.7 kg    SpO2 100%    BMI 23.02 kg/m   Physical Exam 1300: Physical examination:  Nursing notes reviewed; Vital signs and O2 SAT reviewed;  Constitutional: Well developed, Well nourished, Well hydrated, In no acute distress; Head:  Normocephalic, atraumatic; Eyes: EOMI, PERRL, No scleral icterus; ENMT: Mouth and pharynx normal, Mucous membranes moist; Neck: Supple, Full range of motion, No lymphadenopathy; Cardiovascular: Regular rate and rhythm, No gallop; Respiratory: Breath sounds clear & equal bilaterally, No wheezes.  Speaking full sentences with ease, Normal respiratory effort/excursion; Chest: Nontender, Movement normal; Abdomen: Soft, Nontender, Nondistended, Normal bowel sounds; Genitourinary: No CVA tenderness; Extremities: Peripheral pulses normal, No tenderness, No edema, No calf edema or asymmetry.; Neuro: AA&Ox3, Major CN grossly intact.  Speech clear. No gross focal motor or sensory deficits in extremities.; Skin: Color normal, Warm, Dry.    ED Treatments / Results  Labs (all labs ordered are listed, but only abnormal results are displayed)   EKG EKG Interpretation  Date/Time:  Tuesday May 21 2019 12:22:22 EDT Ventricular Rate:  66 PR Interval:  144 QRS Duration: 86 QT Interval:  374 QTC Calculation: 392 R Axis:   53 Text Interpretation:  Normal sinus rhythm Normal ECG When compared with ECG of 09/10/2015 No significant change was found Confirmed by Francine Graven [phone]) on 05/21/2019 12:51:59 PM   Radiology   Procedures Procedures (including critical care time)  Medications Ordered in ED Medications  aspirin chewable tablet 324 mg (324 mg Oral Given 05/21/19 1324)  alum & mag hydroxide-simeth (MAALOX/MYLANTA) 200-200-20 MG/5ML suspension 30 mL (30 mLs Oral Given 05/21/19 1324)    And  lidocaine (XYLOCAINE) 2 % viscous mouth solution 15 mL (15 mLs Oral  Given 05/21/19 1324)     Initial Impression / Assessment and Plan / ED Course  I have reviewed the triage vital signs and the nursing notes.  Pertinent labs & imaging results that were available during my care of the patient were reviewed by me and considered in my medical decision making (see chart for details).     MDM Reviewed: previous chart, nursing note and vitals Reviewed previous: labs and ECG Interpretation: labs, ECG and x-ray   Results for orders placed or performed during the hospital encounter of 86/76/19  Basic metabolic panel  Result Value Ref Range   Sodium 139 135 - 145 mmol/L   Potassium 3.5 3.5 - 5.1 mmol/L   Chloride 105 98 - 111 mmol/L   CO2 26 22 - 32 mmol/L   Glucose, Bld 100 (H) 70 - 99 mg/dL   BUN 17 6 - 20 mg/dL   Creatinine, Ser 0.62 0.44 - 1.00 mg/dL   Calcium 9.5 8.9 - 10.3 mg/dL   GFR calc non Af Amer >60 >60  mL/min   GFR calc Af Amer >60 >60 mL/min   Anion gap 8 5 - 15  CBC  Result Value Ref Range   WBC 6.4 4.0 - 10.5 K/uL   RBC 4.37 3.87 - 5.11 MIL/uL   Hemoglobin 12.9 12.0 - 15.0 g/dL   HCT 40.940.2 81.136.0 - 91.446.0 %   MCV 92.0 80.0 - 100.0 fL   MCH 29.5 26.0 - 34.0 pg   MCHC 32.1 30.0 - 36.0 g/dL   RDW 78.212.7 95.611.5 - 21.315.5 %   Platelets 242 150 - 400 K/uL   nRBC 0.0 0.0 - 0.2 %  D-dimer, quantitative  Result Value Ref Range   D-Dimer, Quant <0.27 0.00 - 0.50 ug/mL-FEU  Troponin I (High Sensitivity)  Result Value Ref Range   Troponin I (High Sensitivity) <2.0 <18 ng/L  Troponin I (High Sensitivity)  Result Value Ref Range   Troponin I (High Sensitivity) <2.0 <18 ng/L   Dg Chest 2 View Result Date: 05/21/2019 CLINICAL DATA:  Acute onset chest pain this morning. EXAM: CHEST - 2 VIEW COMPARISON:  09/11/2017 FINDINGS: The heart size and mediastinal contours are within normal limits. Both lungs are clear. Nipple shadows incidentally noted overlying both lung bases. The visualized skeletal structures are unremarkable. IMPRESSION: No active  cardiopulmonary disease. Electronically Signed   By: Danae OrleansJohn A Stahl M.D.   On: 05/21/2019 13:14    Kimberly Meyers was evaluated in Emergency Department on 05/21/2019 for the symptoms described in the history of present illness. She was evaluated in the context of the global COVID-19 pandemic, which necessitated consideration that the patient might be at risk for infection with the SARS-CoV-2 virus that causes COVID-19. Institutional protocols and algorithms that pertain to the evaluation of patients at risk for COVID-19 are in a state of rapid change based on information released by regulatory bodies including the CDC and federal and state organizations. These policies and algorithms were followed during the patient's care in the ED.   1530:  Doubt PE as cause for symptoms with normal d-dimer and low risk [place].  Doubt ACS as cause for symptoms with normal high sensitivity troponin x2 and unchanged EKG from previous after 3 days of waxing and waning atypical symptoms.  Heart score 2. No clear indication for admission at this time. Dx and testing, as well as incidental finding(s), d/w pt and family.  Questions answered.  Verb understanding, agreeable to d/c home with outpt f/u.          Final Clinical Impressions(s) / ED Diagnoses   Final diagnoses:  None    ED Discharge Orders    None       Samuel JesterMcManus, Johnatan Baskette, DO 05/24/19 [id number]

## 2019-06-28 ENCOUNTER — Other Ambulatory Visit (HOSPITAL_COMMUNITY): Payer: Self-pay | Admitting: Obstetrics and Gynecology

## 2019-06-28 DIAGNOSIS — Z1231 Encounter for screening mammogram for malignant neoplasm of breast: Secondary | ICD-10-CM

## 2019-07-22 ENCOUNTER — Ambulatory Visit (HOSPITAL_COMMUNITY)
Admission: RE | Admit: 2019-07-22 | Discharge: 2019-07-22 | Disposition: A | Discharge status: Home / Self Care | Payer: Self-pay | Source: Ambulatory Visit | Attending: Obstetrics and Gynecology | Admitting: Obstetrics and Gynecology

## 2019-07-22 ENCOUNTER — Other Ambulatory Visit: Payer: Self-pay

## 2019-07-22 DIAGNOSIS — Z1231 Encounter for screening mammogram for malignant neoplasm of breast: Secondary | ICD-10-CM | POA: Insufficient documentation

## 2019-08-15 ENCOUNTER — Other Ambulatory Visit: Payer: Self-pay

## 2019-08-15 ENCOUNTER — Ambulatory Visit: Payer: Self-pay | Attending: Internal Medicine

## 2019-08-15 DIAGNOSIS — Z20828 Contact with and (suspected) exposure to other viral communicable diseases: Secondary | ICD-10-CM | POA: Insufficient documentation

## 2019-08-15 DIAGNOSIS — Z20822 Contact with and (suspected) exposure to covid-19: Secondary | ICD-10-CM

## 2019-08-17 LAB — NOVEL CORONAVIRUS, NAA: SARS-CoV-2, NAA: NOT DETECTED

## 2019-11-01 ENCOUNTER — Ambulatory Visit: Payer: Self-pay | Attending: Internal Medicine

## 2019-11-01 DIAGNOSIS — Z23 Encounter for immunization: Secondary | ICD-10-CM

## 2019-11-01 NOTE — Progress Notes (Signed)
   Covid-19 Vaccination Clinic  Name:  Kimberly Meyers    MRN: [id number] DOB: 05-14-1966  11/01/2019  [name] was observed post Covid-19 immunization for 15 minutes without incident. She was provided with Vaccine Information Sheet and instruction to access the V-Safe system.   [name] was instructed to call 911 with any severe reactions post vaccine: Marland Kitchen Difficulty breathing  . Swelling of face and throat  . A fast heartbeat  . A bad rash all over body  . Dizziness and weakness   Immunizations Administered    Name Date Dose VIS Date Route   Moderna COVID-19 Vaccine 11/01/2019  8:23 AM 0.5 mL 07/16/2019 Intramuscular   Manufacturer: Moderna   Lot: [id number]   NDC: 16579-038-33

## 2019-12-03 ENCOUNTER — Ambulatory Visit: Payer: Self-pay

## 2019-12-04 ENCOUNTER — Ambulatory Visit: Payer: Self-pay | Attending: Internal Medicine

## 2019-12-04 DIAGNOSIS — Z23 Encounter for immunization: Secondary | ICD-10-CM

## 2019-12-04 NOTE — Progress Notes (Signed)
   Covid-19 Vaccination Clinic  Name:  Kimberly Meyers    MRN: [id number] DOB: 04-07-1966  12/04/2019  Ms. Kimberly Meyers was observed post Covid-19 immunization for 15 minutes without incident. She was provided with Vaccine Information Sheet and instruction to access the V-Safe system.   Ms. Kimberly Meyers was instructed to call 911 with any severe reactions post vaccine: Marland Kitchen Difficulty breathing  . Swelling of face and throat  . A fast heartbeat  . A bad rash all over body  . Dizziness and weakness   Immunizations Administered    Name Date Dose VIS Date Route   Moderna COVID-19 Vaccine 12/04/2019  8:27 AM 0.5 mL 07/2019 Intramuscular   Manufacturer: Moderna   Lot: [id number]   NDC: 44975-300-51

## 2020-06-18 ENCOUNTER — Other Ambulatory Visit: Payer: Self-pay

## 2020-06-18 DIAGNOSIS — Z20822 Contact with and (suspected) exposure to covid-19: Secondary | ICD-10-CM

## 2020-06-19 LAB — NOVEL CORONAVIRUS, NAA: SARS-CoV-2, NAA: NOT DETECTED

## 2020-06-19 LAB — SARS-COV-2, NAA 2 DAY TAT

## 2020-06-29 ENCOUNTER — Other Ambulatory Visit (HOSPITAL_COMMUNITY): Payer: Self-pay | Admitting: Obstetrics and Gynecology

## 2020-06-29 DIAGNOSIS — Z1231 Encounter for screening mammogram for malignant neoplasm of breast: Secondary | ICD-10-CM

## 2020-07-30 ENCOUNTER — Other Ambulatory Visit: Payer: Self-pay

## 2020-07-30 DIAGNOSIS — Z20822 Contact with and (suspected) exposure to covid-19: Secondary | ICD-10-CM

## 2020-08-01 LAB — SARS-COV-2, NAA 2 DAY TAT

## 2020-08-01 LAB — NOVEL CORONAVIRUS, NAA: SARS-CoV-2, NAA: NOT DETECTED

## 2020-08-03 ENCOUNTER — Other Ambulatory Visit: Payer: Self-pay

## 2020-08-03 ENCOUNTER — Ambulatory Visit (HOSPITAL_COMMUNITY)
Admission: RE | Admit: 2020-08-03 | Discharge: 2020-08-03 | Disposition: A | Discharge status: Home / Self Care | Payer: Self-pay | Source: Ambulatory Visit | Attending: Obstetrics and Gynecology | Admitting: Obstetrics and Gynecology

## 2020-08-03 DIAGNOSIS — Z1231 Encounter for screening mammogram for malignant neoplasm of breast: Secondary | ICD-10-CM | POA: Insufficient documentation

## 2020-08-20 ENCOUNTER — Ambulatory Visit: Payer: Self-pay

## 2020-10-06 IMAGING — DX DG CHEST 2V
2 series · 2 of 2 positions shown · non-contrast
Comparison: 09/11/2017

CLINICAL DATA: Acute onset chest pain this morning.

EXAM:
CHEST - 2 VIEW

[chest pa]
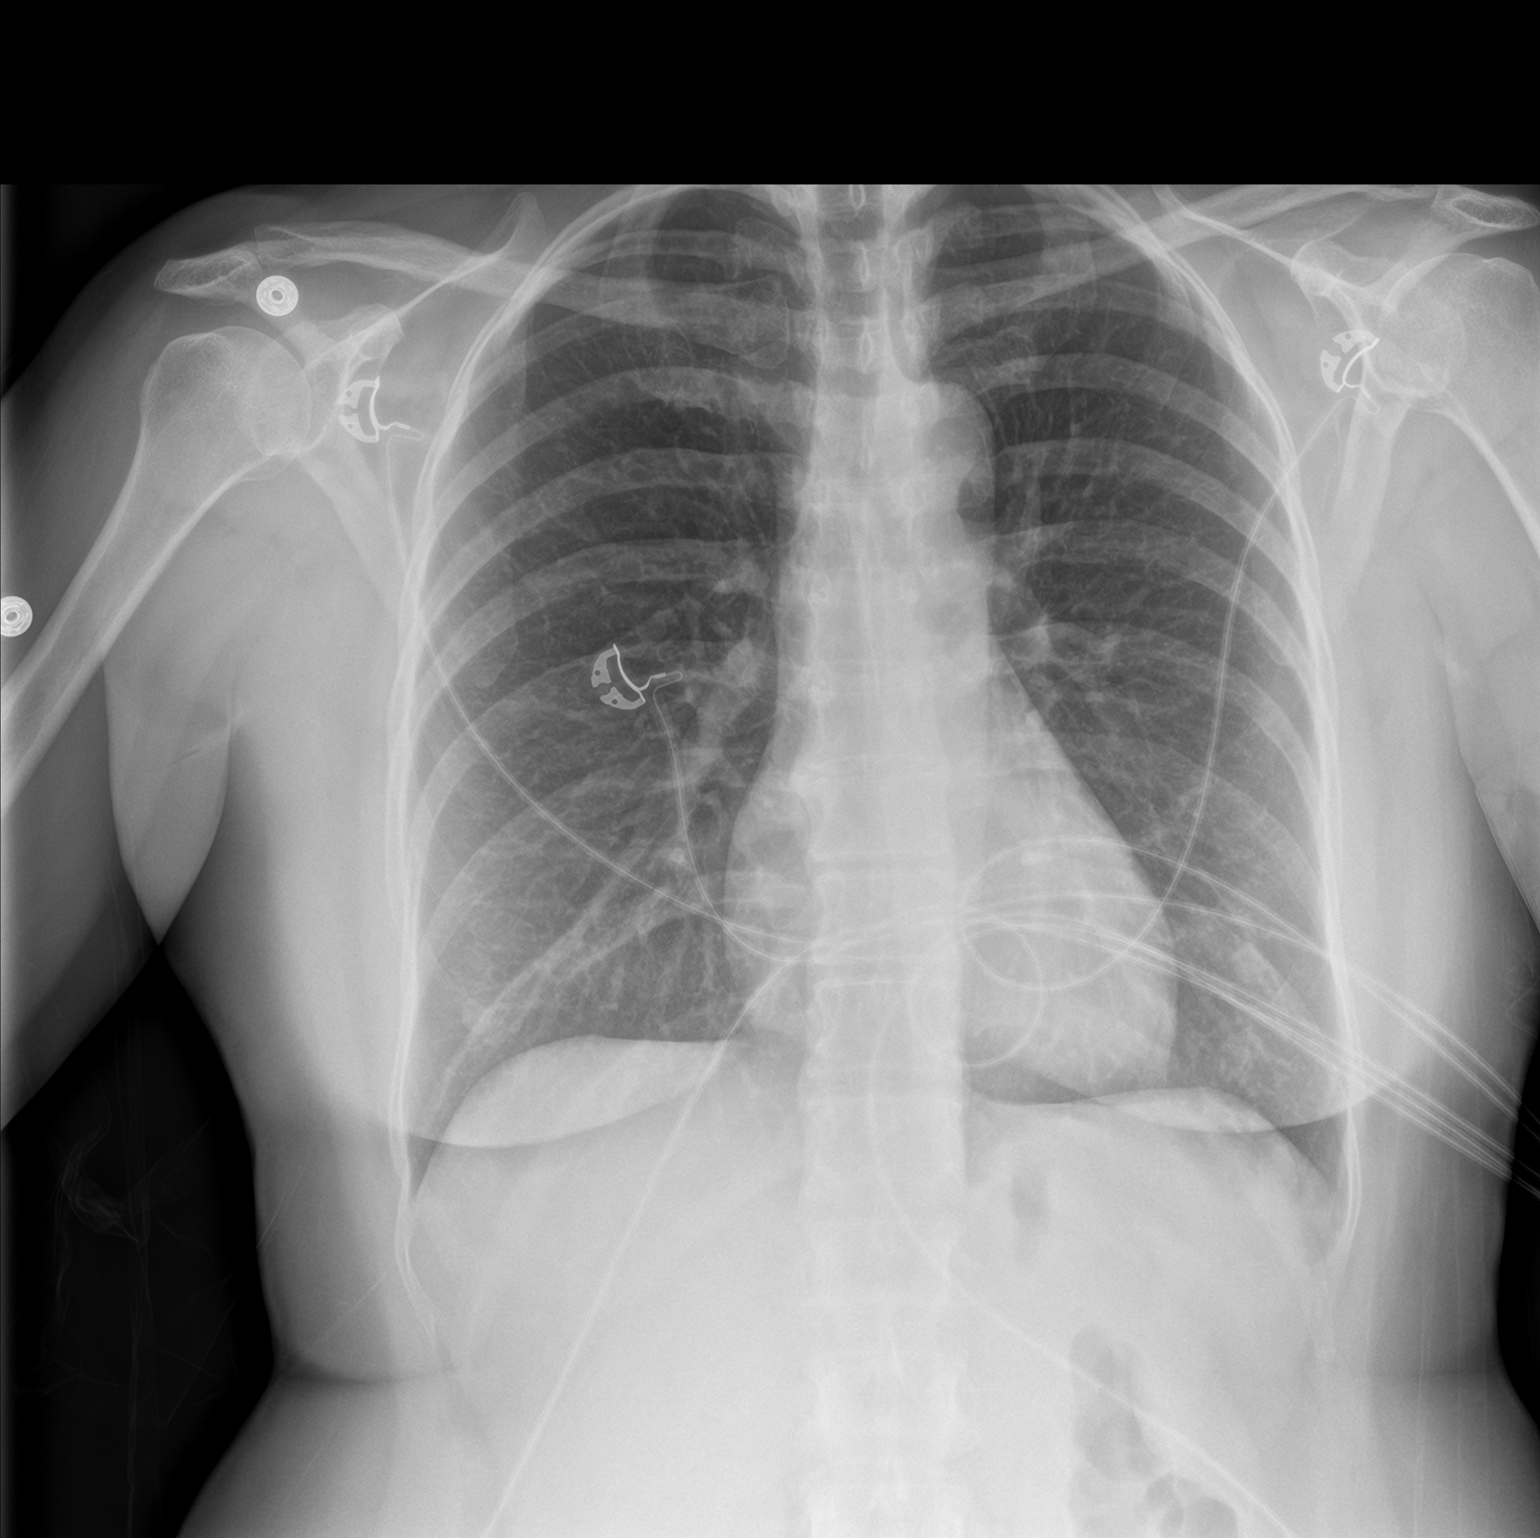

[chest lat]
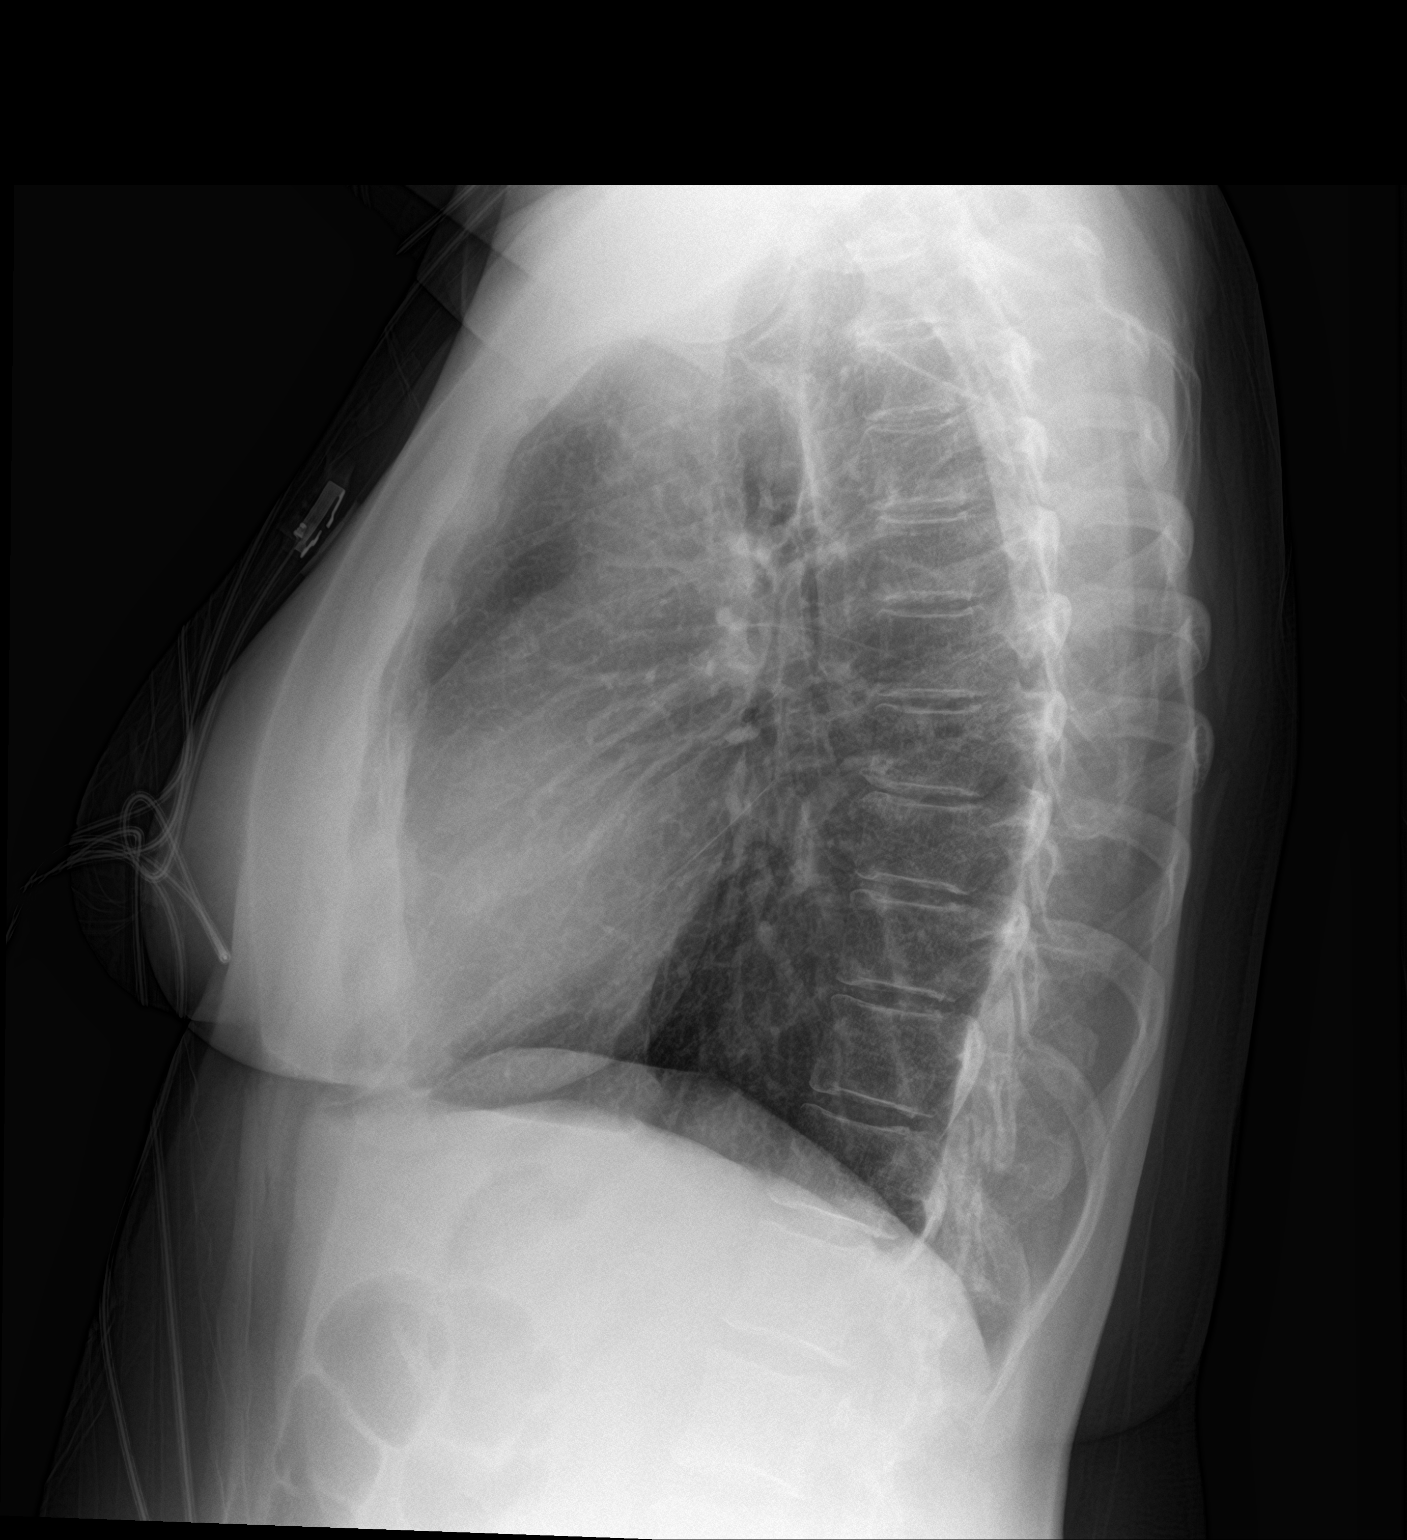

[2 of 2 positions shown; findings below may reference images not displayed]

FINDINGS: The heart size and mediastinal contours are within normal limits.
Both lungs are clear. Nipple shadows incidentally noted overlying
both lung bases. The visualized skeletal structures are
unremarkable.
IMPRESSION: No active cardiopulmonary disease.

## 2020-11-29 ENCOUNTER — Encounter: Payer: Self-pay | Admitting: Emergency Medicine

## 2020-11-29 ENCOUNTER — Other Ambulatory Visit: Payer: Self-pay

## 2020-11-29 ENCOUNTER — Ambulatory Visit
Admission: EM | Admit: 2020-11-29 | Discharge: 2020-11-29 | Disposition: A | Discharge status: Home / Self Care | Payer: Self-pay | Attending: Family Medicine | Admitting: Family Medicine

## 2020-11-29 DIAGNOSIS — J069 Acute upper respiratory infection, unspecified: Secondary | ICD-10-CM

## 2020-11-29 DIAGNOSIS — J040 Acute laryngitis: Secondary | ICD-10-CM

## 2020-11-29 MED ORDER — PSEUDOEPH-BROMPHEN-DM 30-2-10 MG/5ML PO SYRP: 5.0000 mL | ORAL_SOLUTION | Freq: Four times a day (QID) | ORAL | 0 refills | Status: AC | PRN

## 2020-11-29 MED ORDER — PREDNISONE 20 MG PO TABS: 20.0000 mg | ORAL_TABLET | Freq: Every day | ORAL | 0 refills | Status: AC

## 2020-11-29 NOTE — ED Triage Notes (Signed)
Symptoms started on Tuesday, sore throat, coughing and headache, nasal congestion.

## 2020-11-29 NOTE — ED Provider Notes (Signed)
[facility]    CSN: [id number] Arrival date & time: 11/29/20  1216      History   Chief Complaint No chief complaint on file.   HPI Kimberly Meyers is a 55 y.o. female.   HPI  Patient presents with URI symptoms including cough, sore throat, nasal congestion, runny nose, and hoarseness of voice. Nasal symptoms started initially, she treated symptoms with OTC allergy medication. Sore throat , cough with and loss of voice x 2 days. Afebrile. She was working out in her yard for two day prior to onset of symptoms. Past Medical History:  Diagnosis Date  . Complication of anesthesia   . Duodenitis   . GERD (gastroesophageal reflux disease)   . Hiatal hernia   . PONV (postoperative nausea and vomiting)     Patient Active Problem List   Diagnosis Date Noted  . 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 Change in stool 06/16/2014    Past Surgical History:  Procedure Laterality Date  . COLONOSCOPY N/A 06/27/2014   Procedure: COLONOSCOPY;  Surgeon: Malissa Hippo, MD;  Location: [facility];  Service: Endoscopy;  Laterality: N/A;  830  . DIAGNOSTIC LAPAROSCOPY     foe endometriosis  . ESOPHAGOGASTRODUODENOSCOPY N/A 10/16/2015   Procedure: ESOPHAGOGASTRODUODENOSCOPY (EGD);  Surgeon: Malissa Hippo, MD;  Location: [facility];  Service: Endoscopy;  Laterality: N/A;  1240  . HIP SURGERY     torn cartilidge repair.  (left)    OB History   No obstetric history on file.      Home Medications    Prior to Admission medications   Medication Sig Start Date End Date Taking? Authorizing Provider  brompheniramine-pseudoephedrine-DM 30-2-10 MG/5ML syrup Take 5 mLs by mouth 4 (four) times daily as needed. 11/29/20  Yes Bing Neighbors, FNP  predniSONE (DELTASONE) 20 MG tablet Take 1 tablet (20 mg total) by mouth daily with breakfast for 5 days. 11/29/20 12/04/20 Yes Bing Neighbors, FNP  progesterone (PROMETRIUM) 100 MG capsule Take 100 mg by mouth daily.    [provider]  Turmeric (QC TUMERIC COMPLEX) 500 MG CAPS Take 1 capsule by mouth daily.    [provider]  Wheat Dextrin (BENEFIBER DRINK MIX) PACK Take 4 g by mouth at bedtime. Patient not taking: Reported on 05/21/2019 06/27/14   Malissa Hippo, MD  pantoprazole (PROTONIX) 40 MG tablet Take 40 mg by mouth 2 (two) times daily.   02/11/19  [provider]    Family History Family History  Problem Relation Age of Onset  . Cancer Mother     Social History Social History   Tobacco Use  . Smoking status: Current Every Day Smoker  . Smokeless tobacco: Never Used  . Tobacco comment: 1 pack a day x 30 yrs.   Substance Use Topics  . Alcohol use: Yes    Alcohol/week: 0.0 standard drinks    Comment: 8 beers a week  . Drug use: No     Allergies   Penicillins, Prednisone, and Sulfa antibiotics   Review of Systems Review of Systems Pertinent negatives listed in HPI   Physical Exam Triage Vital Signs ED Triage Vitals  Enc Vitals Group     BP 11/29/20 1228 (!) 150/87     Pulse Rate 11/29/20 1228 92     Resp 11/29/20 1228 18     Temp 11/29/20 1228 98 F (36.7 C)     Temp Source 11/29/20 1228 Oral     SpO2  11/29/20 1228 96 %     Weight --      Height --      Head Circumference --      Peak Flow --      Pain Score 11/29/20 1230 2     Pain Loc --      Pain Edu? --      Excl. in GC? --    No data found.  Updated Vital Signs BP (!) 150/87 (BP Location: Right Arm)   Pulse 92   Temp 98 F (36.7 C) (Oral)   Resp 18   SpO2 96%   Visual Acuity Right Eye Distance:   Left Eye Distance:   Bilateral Distance:    Right Eye Near:   Left Eye Near:    Bilateral Near:     Physical Exam  General Appearance:    Alert, cooperative, no distress  HENT:   Normocephalic, ears normal, nares mucosal edema with congestion, rhinorrhea, oropharynx patent  Eyes:    PERRL, conjunctiva/corneas clear, EOM's intact       Lungs:     Clear to auscultation bilaterally,  respirations unlabored  Heart:    Regular rate and rhythm  Neurologic:   Awake, alert, oriented x 3. No apparent focal neurological           defect.    UC Treatments / Results  Labs (all labs ordered are listed, but only abnormal results are displayed) Labs Reviewed - No data to display  EKG   Radiology No results found.  Procedures Procedures (including critical care time)  Medications Ordered in UC Medications - No data to display  Initial Impression / Assessment and Plan / UC Course  I have reviewed the triage vital signs and the nursing notes.  Pertinent labs & imaging results that were available during my care of the patient were reviewed by me and considered in my medical decision making (see chart for details).    Viral URI with associated laryngitis. Treatment of symptoms warranted only. Continue OTC flonase and Xyzal. Follow-up with PCP as needed  Final Clinical Impressions(s) / UC Diagnoses   Final diagnoses:  Laryngitis  Upper respiratory tract infection, unspecified type   Discharge Instructions   None    ED Prescriptions    Medication Sig Dispense Auth. Provider   brompheniramine-pseudoephedrine-DM 30-2-10 MG/5ML syrup Take 5 mLs by mouth 4 (four) times daily as needed. 140 mL Bing Neighbors, FNP   predniSONE (DELTASONE) 20 MG tablet Take 1 tablet (20 mg total) by mouth daily with breakfast for 5 days. 5 tablet Bing Neighbors, FNP     PDMP not reviewed this encounter.   Bing Neighbors, FNP 11/29/20 1312

## 2020-11-29 NOTE — ED Triage Notes (Signed)
Has been taken advil cold and sinus, cholraseptic throat spray, flonase, and saline spray to treat symptoms.

## 2021-07-14 ENCOUNTER — Other Ambulatory Visit (HOSPITAL_COMMUNITY): Payer: Self-pay | Admitting: Obstetrics and Gynecology

## 2021-07-14 DIAGNOSIS — Z1231 Encounter for screening mammogram for malignant neoplasm of breast: Secondary | ICD-10-CM

## 2021-08-11 ENCOUNTER — Other Ambulatory Visit: Payer: Self-pay

## 2021-08-11 ENCOUNTER — Ambulatory Visit (HOSPITAL_COMMUNITY)
Admission: RE | Admit: 2021-08-11 | Discharge: 2021-08-11 | Disposition: A | Discharge status: Home / Self Care | Payer: Self-pay | Source: Ambulatory Visit | Attending: Obstetrics and Gynecology | Admitting: Obstetrics and Gynecology

## 2021-08-11 DIAGNOSIS — Z1231 Encounter for screening mammogram for malignant neoplasm of breast: Secondary | ICD-10-CM | POA: Insufficient documentation

## 2021-12-20 IMAGING — MG DIGITAL SCREENING BILAT W/ TOMO W/ CAD
8 series · 9 of 24 positions shown · non-contrast
Comparison: Previous exam(s).

CLINICAL DATA: Screening.

EXAM:
DIGITAL SCREENING BILATERAL MAMMOGRAM WITH TOMO AND CAD

[L CC synth-2D]
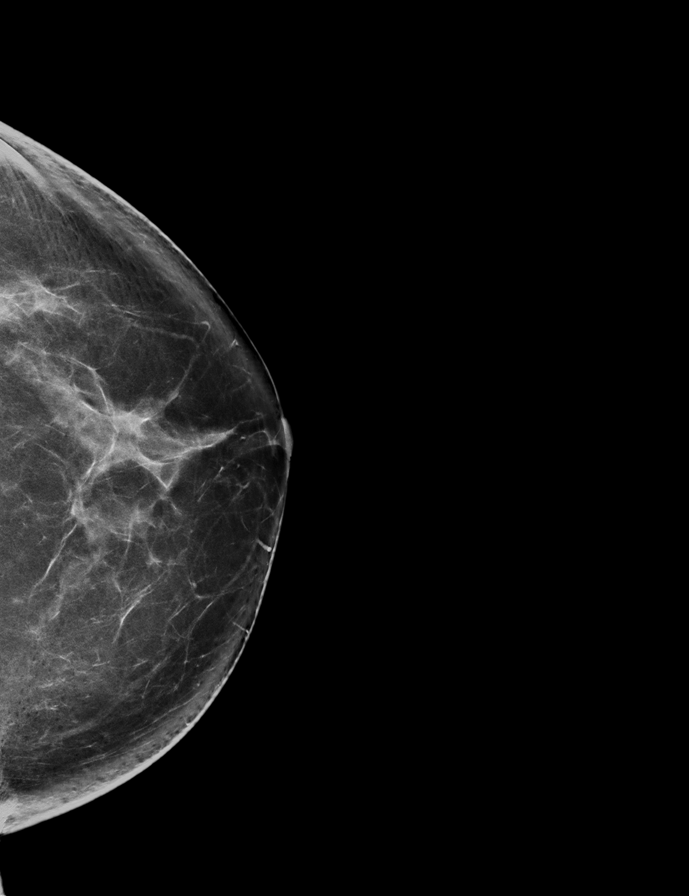

[L MLO synth-2D]
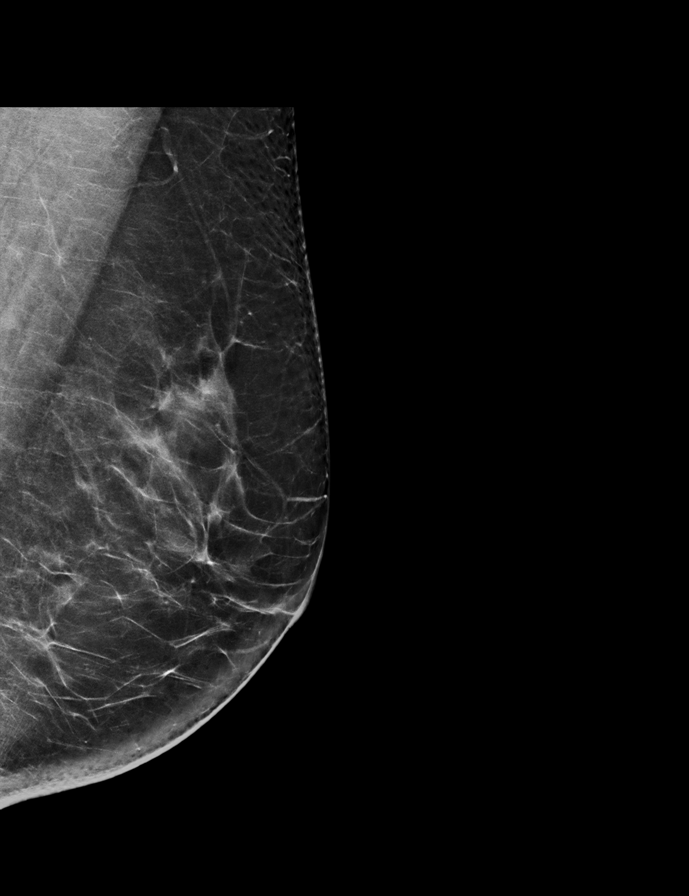

[R CC synth-2D]
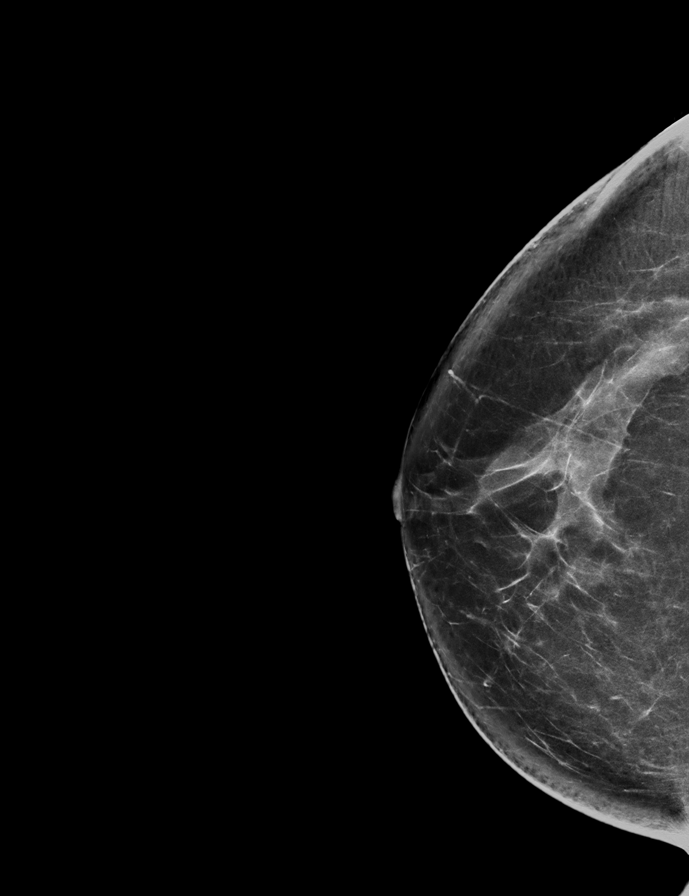

[R MLO synth-2D]
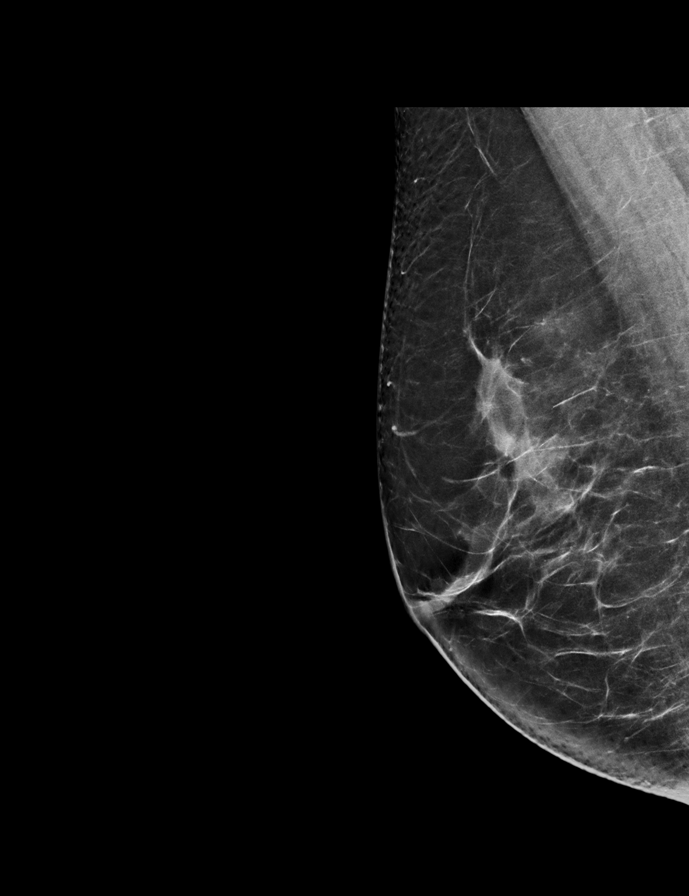

[L CC tomo · 2 of 84 frames shown]
[frame 28/84]
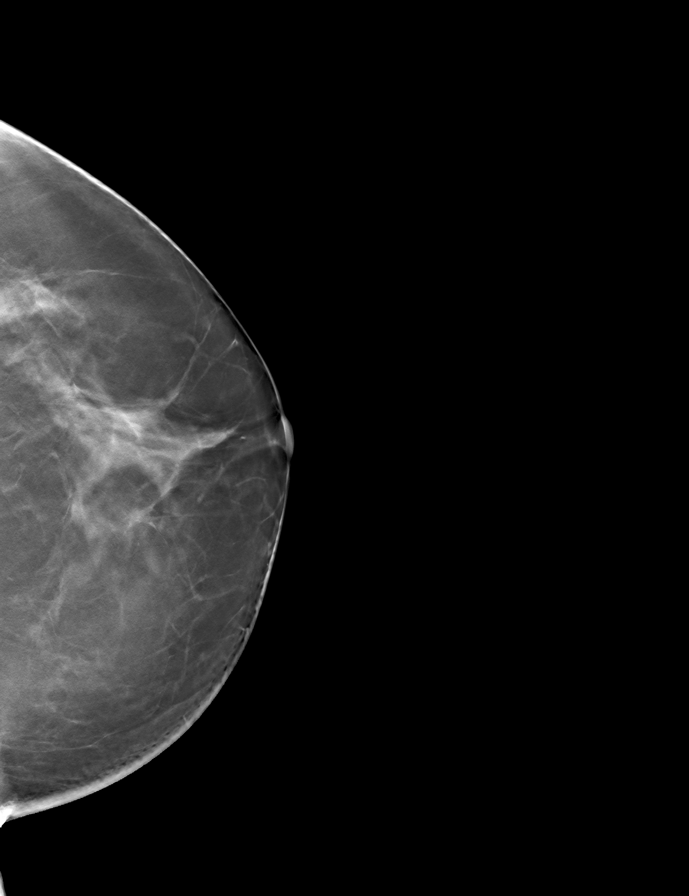
[frame 43/84]
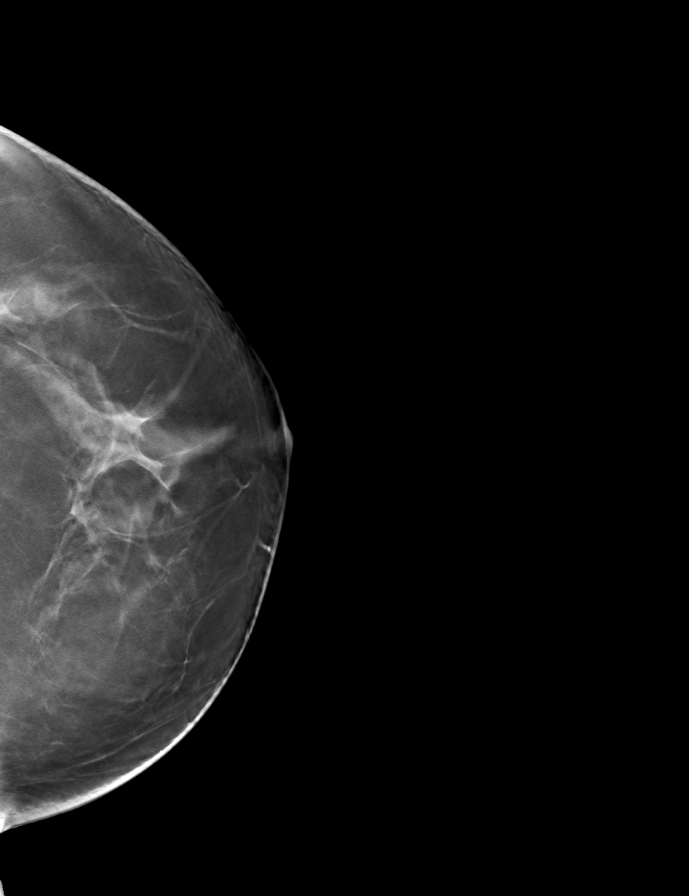

[R CC tomo · tomo slice 42/83.0]
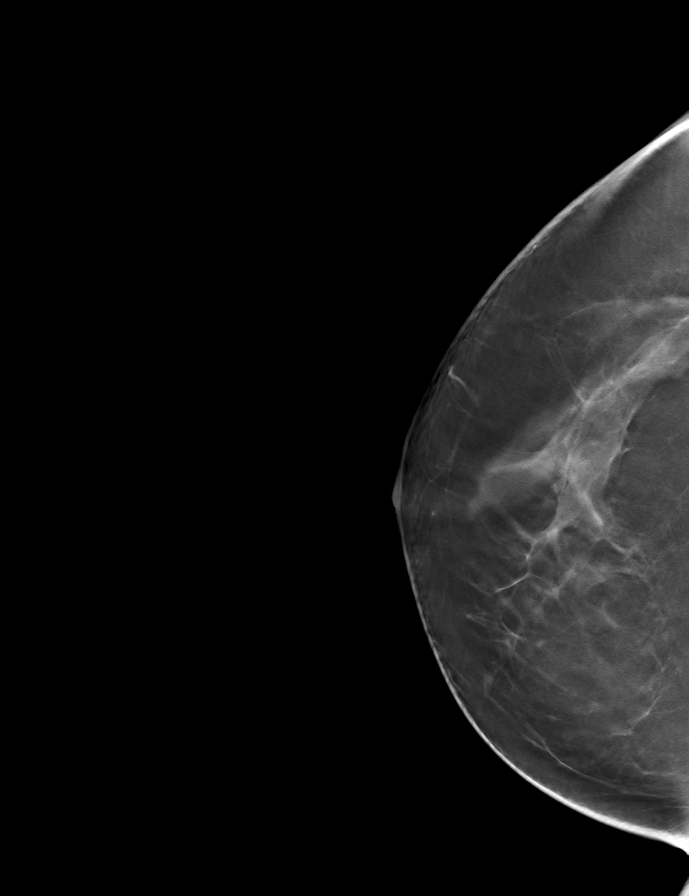

[R MLO tomo · tomo slice 38/75.0]
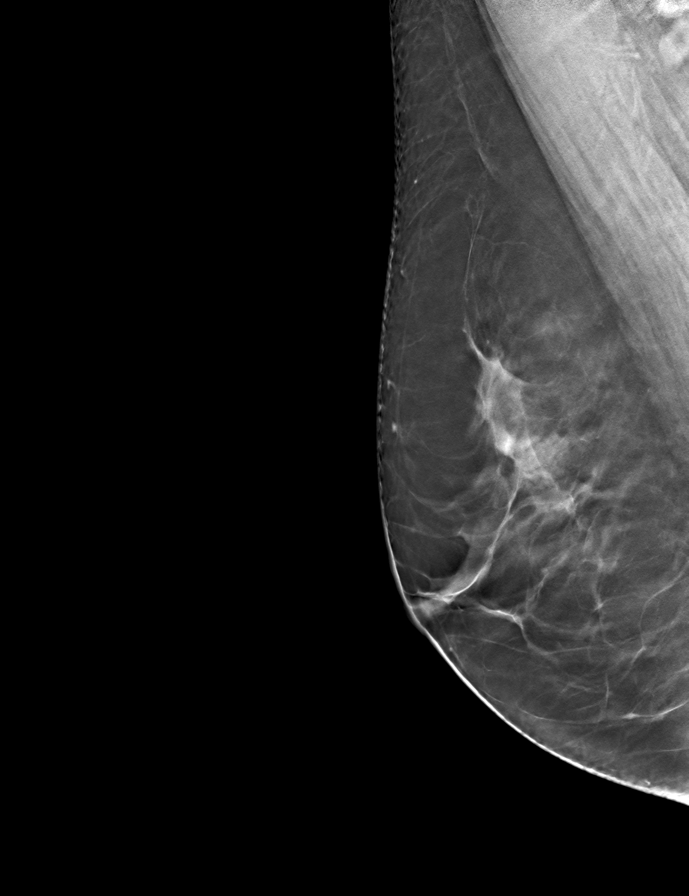

[L MLO tomo · tomo slice 39/78.0]
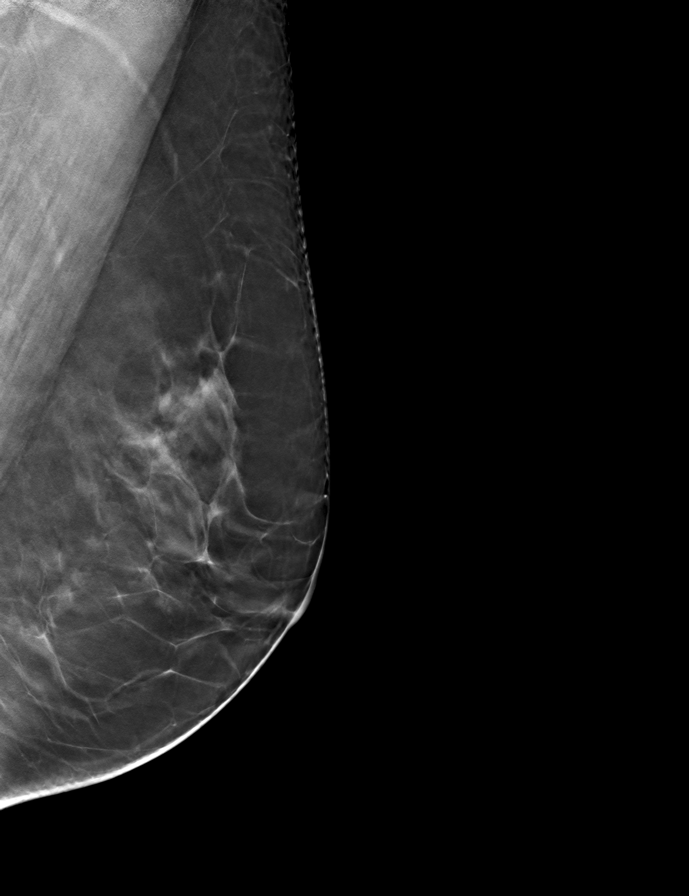

[9 of 24 positions shown; findings below may reference images not displayed]

ACR Breast Density Category c: The breast tissue is heterogeneously
dense, which may obscure small masses.
FINDINGS: There are no findings suspicious for malignancy. Images were
processed with CAD.
IMPRESSION: No mammographic evidence of malignancy. A result letter of this
screening mammogram will be mailed directly to the patient.

RECOMMENDATION:
Screening mammogram in one year. (Code:[id number])

BI-RADS CATEGORY  1: Negative.

## 2022-06-22 ENCOUNTER — Other Ambulatory Visit (HOSPITAL_COMMUNITY): Payer: Self-pay | Admitting: Internal Medicine

## 2022-06-22 DIAGNOSIS — E785 Hyperlipidemia, unspecified: Secondary | ICD-10-CM

## 2022-07-25 ENCOUNTER — Other Ambulatory Visit (HOSPITAL_COMMUNITY): Payer: Self-pay

## 2022-09-05 ENCOUNTER — Ambulatory Visit (HOSPITAL_COMMUNITY)
Admission: RE | Admit: 2022-09-05 | Discharge: 2022-09-05 | Disposition: A | Discharge status: Home / Self Care | Payer: Self-pay | Source: Ambulatory Visit | Attending: Internal Medicine | Admitting: Internal Medicine

## 2022-09-05 DIAGNOSIS — E785 Hyperlipidemia, unspecified: Secondary | ICD-10-CM | POA: Insufficient documentation

## 2022-09-26 ENCOUNTER — Other Ambulatory Visit (HOSPITAL_COMMUNITY): Payer: Self-pay | Admitting: Obstetrics and Gynecology

## 2022-09-26 DIAGNOSIS — Z1231 Encounter for screening mammogram for malignant neoplasm of breast: Secondary | ICD-10-CM

## 2022-09-30 ENCOUNTER — Encounter (HOSPITAL_COMMUNITY): Payer: Self-pay

## 2022-09-30 ENCOUNTER — Ambulatory Visit (HOSPITAL_COMMUNITY)
Admission: RE | Admit: 2022-09-30 | Discharge: 2022-09-30 | Disposition: A | Discharge status: Home / Self Care | Payer: Self-pay | Source: Ambulatory Visit | Attending: Obstetrics and Gynecology | Admitting: Obstetrics and Gynecology

## 2022-09-30 DIAGNOSIS — Z1231 Encounter for screening mammogram for malignant neoplasm of breast: Secondary | ICD-10-CM | POA: Insufficient documentation

## 2023-10-09 ENCOUNTER — Other Ambulatory Visit (HOSPITAL_COMMUNITY): Payer: Self-pay | Admitting: Obstetrics and Gynecology

## 2023-10-09 DIAGNOSIS — Z1231 Encounter for screening mammogram for malignant neoplasm of breast: Secondary | ICD-10-CM

## 2023-10-23 ENCOUNTER — Ambulatory Visit (HOSPITAL_COMMUNITY)
Admission: RE | Admit: 2023-10-23 | Discharge: 2023-10-23 | Disposition: A | Discharge status: Home / Self Care | Payer: Self-pay | Source: Ambulatory Visit | Attending: Obstetrics and Gynecology | Admitting: Obstetrics and Gynecology

## 2023-10-23 DIAGNOSIS — Z1231 Encounter for screening mammogram for malignant neoplasm of breast: Secondary | ICD-10-CM | POA: Insufficient documentation

## 2024-06-25 ENCOUNTER — Encounter (INDEPENDENT_AMBULATORY_CARE_PROVIDER_SITE_OTHER): Payer: Self-pay | Admitting: *Deleted
